# Patient Record
Sex: Female | Born: 1990 | Race: White | Hispanic: No | Marital: Married | Smoking: Never smoker
Health system: Southern US, Community
[De-identification: ages and names within clinical notes are randomized; demographics above are authoritative.]

## PROBLEM LIST (undated history)

## (undated) ENCOUNTER — Inpatient Hospital Stay: Payer: Self-pay

## (undated) DIAGNOSIS — Z789 Other specified health status: Secondary | ICD-10-CM

---

## 2016-12-17 DIAGNOSIS — O0993 Supervision of high risk pregnancy, unspecified, third trimester: Secondary | ICD-10-CM | POA: Insufficient documentation

## 2016-12-18 ENCOUNTER — Other Ambulatory Visit: Payer: Self-pay | Admitting: Obstetrics and Gynecology

## 2016-12-18 DIAGNOSIS — Z369 Encounter for antenatal screening, unspecified: Secondary | ICD-10-CM

## 2016-12-31 ENCOUNTER — Encounter: Payer: Self-pay | Admitting: *Deleted

## 2016-12-31 ENCOUNTER — Ambulatory Visit
Admission: RE | Admit: 2016-12-31 | Discharge: 2016-12-31 | Disposition: A | Discharge status: Home / Self Care | Payer: BLUE CROSS/BLUE SHIELD | Source: Ambulatory Visit | Attending: Obstetrics & Gynecology | Admitting: Obstetrics & Gynecology

## 2016-12-31 VITALS — BP 129/71 | HR 74 | Temp 98.1°F | Resp 18 | Wt 181.8 lb

## 2016-12-31 DIAGNOSIS — Z1389 Encounter for screening for other disorder: Secondary | ICD-10-CM | POA: Diagnosis not present

## 2016-12-31 DIAGNOSIS — Z3481 Encounter for supervision of other normal pregnancy, first trimester: Secondary | ICD-10-CM | POA: Diagnosis not present

## 2016-12-31 DIAGNOSIS — Z3A12 12 weeks gestation of pregnancy: Secondary | ICD-10-CM | POA: Insufficient documentation

## 2016-12-31 DIAGNOSIS — Z369 Encounter for antenatal screening, unspecified: Secondary | ICD-10-CM

## 2016-12-31 HISTORY — DX: Other specified health status: Z78.9

## 2016-12-31 NOTE — Progress Notes (Signed)
Length of Consultation: 45 minute consultation   Maria Escobar  was referred to [facility] for genetic counseling to review prenatal screening and testing options.  This note summarizes the information we discussed.    We offered the following routine screening tests for this pregnancy:  First trimester screening, which includes nuchal translucency ultrasound screen and first trimester maternal serum marker screening.  The nuchal translucency has approximately an 80% detection rate for Down syndrome and can be positive for other chromosome abnormalities as well as congenital heart defects.  When combined with a maternal serum marker screening, the detection rate is up to 90% for Down syndrome and up to 97% for trisomy 18.     Maternal serum marker screening, a blood test that measures pregnancy proteins, can provide risk assessments for Down syndrome, trisomy 18, and open neural tube defects (spina bifida, anencephaly). Because it does not directly examine the fetus, it cannot positively diagnose or rule out these problems.  Targeted ultrasound uses high frequency sound waves to create an image of the developing fetus.  An ultrasound is often recommended as a routine means of evaluating the pregnancy.  It is also used to screen for fetal anatomy problems (for example, a heart defect) that might be suggestive of a chromosomal or other abnormality.   Should these screening tests indicate an increased concern, then the following additional testing options would be offered:  The chorionic villus sampling procedure is available for first trimester chromosome analysis.  This involves the withdrawal of a small amount of chorionic villi (tissue from the developing placenta).  Risk of pregnancy loss is estimated to be approximately 1 in 200 to 1 in 100 (0.5 to 1%).  There is approximately a 1% (1 in 100) chance that the CVS chromosome results will be unclear.  Chorionic villi  cannot be tested for neural tube defects.     Amniocentesis involves the removal of a small amount of amniotic fluid from the sac surrounding the fetus with the use of a thin needle inserted through the maternal abdomen and uterus.  Ultrasound guidance is used throughout the procedure.  Fetal cells from amniotic fluid are directly evaluated and > 99.5% of chromosome problems and > 98% of open neural tube defects can be detected. This procedure is generally performed after the 15th week of pregnancy.  The main risks to this procedure include complications leading to miscarriage in less than 1 in 200 cases (0.5%).  As another option for information if the pregnancy is suspected to be an an increased chance for certain chromosome conditions, we also reviewed the availability of cell free fetal DNA testing from maternal blood to determine whether or not the baby may have either Down syndrome, trisomy 54, or trisomy 77.  This test utilizes a maternal blood sample and DNA sequencing technology to isolate circulating cell free fetal DNA from maternal plasma.  The fetal DNA can then be analyzed for DNA sequences that are derived from the three most common chromosomes involved in aneuploidy, chromosomes 13, 18, and 21.  If the overall amount of DNA is greater than the expected level for any of these chromosomes, aneuploidy is suspected.  While we do not consider it a replacement for invasive testing and karyotype analysis, a negative result from this testing would be reassuring, though not a guarantee of a normal chromosome complement for the baby.  An abnormal result is certainly suggestive of an abnormal chromosome complement, though we would still  recommend CVS or amniocentesis to confirm any findings from this testing.  Cystic Fibrosis and Spinal Muscular Atrophy (SMA) screening were also discussed with the patient. Both conditions are recessive, which means that both parents must be carriers in order to have a  child with the disease.  Cystic fibrosis (CF) is one of the most common genetic conditions in persons of Caucasian ancestry.  This condition occurs in approximately 1 in 2,500 Caucasian persons and results in thickened secretions in the lungs, digestive, and reproductive systems.  For a baby to be at risk for having CF, both of the parents must be carriers for this condition.  Approximately 1 in 8125 Caucasian persons is a carrier for CF.  Current carrier testing looks for the most common mutations in the gene for CF and can detect approximately 90% of carriers in the Caucasian population.  This means that the carrier screening can greatly reduce, but cannot eliminate, the chance for an individual to have a child with CF.  If an individual is found to be a carrier for CF, then carrier testing would be available for the partner. As part of [place]'s newborn screening profile, all babies born in the state of [place] will have a two-tier screening process.  Specimens are first tested to determine the concentration of immunoreactive trypsinogen (IRT).  The top 5% of specimens with the highest IRT values then undergo DNA testing using a panel of over 40 common CF mutations. SMA is a neurodegenerative disorder that leads to atrophy of skeletal muscle and overall weakness.  This condition is also more prevalent in the Caucasian population, with 1 in 40-1 in 60 persons being a carrier and 1 in 6,000-1 in 10,000 children being affected.  There are multiple forms of the disease, with some causing death in infancy to other forms with survival into adulthood.  The genetics of SMA is complex, but carrier screening can detect up to 95% of carriers in the Caucasian population.  Similar to CF, a negative result can greatly reduce, but cannot eliminate, the chance to have a child with SMA.  We obtained a detailed family history and pregnancy history.  The remainder of the family history was reported to be unremarkable  for birth defects, intellectual delays, recurrent pregnancy loss or known chromosome abnormalities.  Consanguinity is denied.    Maria Escobar reported that she had vaginal spotting and was informed that he had a subchorionic bleed. No additional complications or exposures are reported.  After consideration of the options, Ms. Rossini elected to proceed with first trimester screening if a subchorionic hemorrhage is not identified.  We reviewed that bleeding and subchorionic hemorrhage have been demonstrated to increase the risk for a false positive.  The couple are undecided about carrier screening for CF and SMA and will contact their physician if screening is desired.  An ultrasound was performed at the time of the visit.  The gestational age was consistent with 12 weeks, 2 days.  Fetal anatomy could not be assessed due to early gestational age.  Please refer to the ultrasound report for details of that study.  Maria Escobar was encouraged to call with questions or concerns.  We can be contacted at [phone].   Carilyn GoodpastureKristin Nunez CGC  Raymond Azure, [place], MD

## 2017-01-07 ENCOUNTER — Telehealth: Payer: Self-pay | Admitting: Obstetrics and Gynecology

## 2017-01-07 NOTE — Telephone Encounter (Signed)
Ms. Maria Escobar  elected to undergo First Trimester screening on 12/31/2016.  To review, first trimester screening, includes nuchal translucency ultrasound screen and/or first trimester maternal serum marker screening.  The nuchal translucency has approximately an 80% detection rate for Down syndrome and can be positive for other chromosome abnormalities as well as heart defects.  When combined with a maternal serum marker screening, the detection rate is up to 90% for Down syndrome and up to 97% for trisomy 13 and 18.     The results of the First Trimester Nuchal Translucency and Biochemical Screening were within normal range.  The risk for Down syndrome is now estimated to be 1 in >10,000.  The risk for Trisomy 13/18 is 1 in 6,619.  Should more definitive information be desired, we would offer amniocentesis.  Because we do not yet know the effectiveness of combined first and second trimester screening, we do not recommend a maternal serum screen to assess the chance for chromosome conditions.  However, if screening for neural tube defects is desired, maternal serum screening for AFP only can be performed between 15 and [redacted] weeks gestation.     Cherly Andersoneborah F. Dezmin Kittelson, MS, CGC

## 2017-05-06 ENCOUNTER — Inpatient Hospital Stay: Payer: BLUE CROSS/BLUE SHIELD

## 2017-05-06 ENCOUNTER — Inpatient Hospital Stay
Admission: EM | Admit: 2017-05-06 | Discharge: 2017-05-06 | Disposition: A | Discharge status: Home / Self Care | Payer: BLUE CROSS/BLUE SHIELD | Attending: Obstetrics and Gynecology | Admitting: Obstetrics and Gynecology

## 2017-05-06 DIAGNOSIS — O26853 Spotting complicating pregnancy, third trimester: Secondary | ICD-10-CM | POA: Insufficient documentation

## 2017-05-06 DIAGNOSIS — O4693 Antepartum hemorrhage, unspecified, third trimester: Secondary | ICD-10-CM

## 2017-05-06 DIAGNOSIS — Z8249 Family history of ischemic heart disease and other diseases of the circulatory system: Secondary | ICD-10-CM | POA: Insufficient documentation

## 2017-05-06 DIAGNOSIS — Z3A32 32 weeks gestation of pregnancy: Secondary | ICD-10-CM | POA: Insufficient documentation

## 2017-05-06 DIAGNOSIS — Z833 Family history of diabetes mellitus: Secondary | ICD-10-CM | POA: Diagnosis not present

## 2017-05-06 MED ORDER — PRENATAL MULTIVITAMIN CH: 1.0000 | ORAL_TABLET | Freq: Every day | ORAL | Status: DC

## 2017-05-06 MED ORDER — ACETAMINOPHEN 325 MG PO TABS: 650.0000 mg | ORAL_TABLET | ORAL | Status: DC | PRN

## 2017-05-06 MED ORDER — ZOLPIDEM TARTRATE 5 MG PO TABS: 5.0000 mg | ORAL_TABLET | Freq: Every evening | ORAL | Status: DC | PRN

## 2017-05-06 MED ORDER — DOCUSATE SODIUM 100 MG PO CAPS: 100.0000 mg | ORAL_CAPSULE | Freq: Every day | ORAL | Status: DC

## 2017-05-06 MED ORDER — CALCIUM CARBONATE ANTACID 500 MG PO CHEW: 2.0000 | CHEWABLE_TABLET | ORAL | Status: DC | PRN

## 2017-05-06 NOTE — Final Progress Note (Signed)
Pt d/c home with precautions

## 2017-05-06 NOTE — OB Triage Provider Note (Signed)
TRIAGE NOTE to rule out Preterm Labor   History of Present Illness: Maria Escobar is a 26 y.o. G1P0 at [redacted]w[redacted]d presenting to triage for vaginal bleeding on toilet paper this morning. Hx of subchorionic hemorrhage in first trimester, resolved.   Patient reports the fetal movement as active. Patient reports uterine contraction  activity as none. Patient reports  vaginal bleeding as spotting. Patient describes fluid per vagina as None.   Patient Active Problem List   Diagnosis Date Noted  . Encounter for routine screening for malformation using ultrasound     Past Medical History:  Diagnosis Date  . Medical history non-contributory     History reviewed. No pertinent surgical history.  OB History  Gravida Para Term Preterm AB Living  1         0  SAB TAB Ectopic Multiple Live Births               # Outcome Date GA Lbr Len/2nd Weight Sex Delivery Anes PTL Lv  1 Current               Social History   Social History  . Marital status: Married    Spouse name: N/A  . Number of children: N/A  . Years of education: N/A   Social History Main Topics  . Smoking status: Never Smoker  . Smokeless tobacco: Never Used  . Alcohol use No  . Drug use: No  . Sexual activity: Yes   Other Topics Concern  . None   Social History Narrative  . None    Family History  Problem Relation Age of Onset  . Diabetes Father   . Heart disease Father     No Known Allergies  Prescriptions Prior to Admission  Medication Sig Dispense Refill Last Dose  . Prenatal Vit-Fe Fumarate-FA (PRENATAL MULTIVITAMIN) TABS tablet Take 1 tablet by mouth daily at 12 noon.       Review of Systems - See HPI for OB specific ROS.   Vitals:  BP 130/77 (BP Location: Right Arm)   Temp 98.5 F (36.9 C) (Oral)   Resp 18   LMP 10/06/2016  Physical Examination: CONSTITUTIONAL: Well-developed, well-nourished female in no acute distress.  HENT:  Normocephalic, atraumatic EYES: Conjunctivae and EOM are  normal. No scleral icterus.  NECK: Normal range of motion, supple, SKIN: Skin is warm and dry. No rash noted. Not diaphoretic. No erythema. No pallor. NEUROLGIC: Alert and oriented to person, place, and time. No gross cranial nerve deficit noted. PSYCHIATRIC: Normal mood and affect. Normal behavior. Normal judgment and thought content. CARDIOVASCULAR: Normal heart rate noted, regular rhythm RESPIRATORY: Effort and breath sounds normal, no problems with respiration noted ABDOMEN: Soft, nontender, nondistended, gravid.  Cervix: not assessed Membranes:intact Fetal Monitoring:Baseline: 150 bpm, Variability: Good {> 6 bpm), Accelerations: Reactive and Decelerations: Absent Tocometer: occasional contractions   Labs:  No results found for this or any previous visit (from the past 24 hour(s)).  Imaging Studies: US Ob Limited  Result Date: 05/06/2017 CLINICAL DATA:  Vaginal bleeding EXAM: LIMITED OBSTETRIC ULTRASOUND FINDINGS: Number of Fetuses: 1 Heart Rate:  145 bpm Movement: Visualized Presentation: Cephalic Placental Location: Anterior fundal Previa: None Amniotic Fluid (Subjective):  Within normal limits.  AFI:  13.9 cm BPD:  8.09cm 32w  3d MATERNAL FINDINGS: Cervix:  Closed, 3.9 cm in length. Uterus/Adnexae: No abnormality visualized. IMPRESSION: Approximately 32 week 3 day intrauterine pregnancy. Fetal heart rate 145 beats per minute. Cephalic presentation. No acute maternal findings. This exam is performed  on an emergent basis and does not comprehensively evaluate fetal size, dating, or anatomy; follow-up complete OB US should be considered if further fetal assessment is warranted. Electronically Signed   By: Charlett NoseKevin  Dover M.D.   On: 05/06/2017 10:16     Assessment and Plan: Patient Active Problem List   Diagnosis Date Noted  . Encounter for routine screening for malformation using ultrasound     1.Cervical length appropriate. Good AFI. Cat I strip. Discharge home with precautions.    Cline CoolsBethany E Tracina Beaumont, MD, MPH

## 2017-05-06 NOTE — Progress Notes (Signed)
Pt back in OBS 4 frm [place]

## 2017-05-06 NOTE — OB Triage Note (Signed)
Pt states that this morning when she wiped she had some blood on her toilet paper. Some cramping on Tuesday, she hydrated herself and it went away. Denies any pain.

## 2017-07-13 NOTE — L&D Delivery Note (Signed)
Delivery Note At 9:40 PM a viable female was delivered via Vaginal, Spontaneous (Presentation: vtx ROA;  ).  APGAR: 9, 9; weight  Unassigned as of yet.   Placenta status:intact , .  Cord3v  with the following complications:none .  Cord pH: not done Progressed into second stage well  And over an intact perineum delivered a vigorous female McRobert's aided in delivery of the anterior shoulder. Delayed cord clamping . Placenta delivered intact shortly thereafter . Second degree laceration and bilateral labial lac repaired . Rectal exam confirm an intact sphincter / rectum . 100 mcg of Fentanyl IV used for analgesia  Anesthesia:  IV  Stadol and fentanyl  Episiotomy: None Lacerations:  Second  Suture Repair: 2.0 3.0 vicryl Est. Blood Loss (mL):  350 cc  Mom to postpartum.  Baby to Couplet care / Skin to Skin.  Maria Escobar 07/16/2017, 10:13 PM

## 2017-07-16 ENCOUNTER — Encounter: Payer: Self-pay | Admitting: *Deleted

## 2017-07-16 ENCOUNTER — Inpatient Hospital Stay
Admission: EM | Admit: 2017-07-16 | Discharge: 2017-07-18 | DRG: 807 | Disposition: A | Discharge status: Home / Self Care | Payer: BLUE CROSS/BLUE SHIELD | Attending: Obstetrics and Gynecology | Admitting: Obstetrics and Gynecology

## 2017-07-16 DIAGNOSIS — O479 False labor, unspecified: Secondary | ICD-10-CM | POA: Diagnosis present

## 2017-07-16 DIAGNOSIS — O99214 Obesity complicating childbirth: Principal | ICD-10-CM | POA: Diagnosis present

## 2017-07-16 DIAGNOSIS — Z3A40 40 weeks gestation of pregnancy: Secondary | ICD-10-CM

## 2017-07-16 DIAGNOSIS — Z3483 Encounter for supervision of other normal pregnancy, third trimester: Secondary | ICD-10-CM | POA: Diagnosis present

## 2017-07-16 DIAGNOSIS — E669 Obesity, unspecified: Secondary | ICD-10-CM | POA: Diagnosis present

## 2017-07-16 LAB — CBC
HEMATOCRIT: 36.9 % (ref 35.0–47.0)
HEMOGLOBIN: 12.4 g/dL (ref 12.0–16.0)
MCH: 29.6 pg (ref 26.0–34.0)
MCHC: 33.6 g/dL (ref 32.0–36.0)
MCV: 87.9 fL (ref 80.0–100.0)
Platelets: 329 10*3/uL (ref 150–440)
RBC: 4.2 MIL/uL (ref 3.80–5.20)
RDW: 13.6 % (ref 11.5–14.5)
WBC: 19.2 10*3/uL — ABNORMAL HIGH (ref 3.6–11.0)

## 2017-07-16 MED ORDER — AMMONIA AROMATIC IN INHA
RESPIRATORY_TRACT | Status: AC
  Filled 2017-07-16: qty 10

## 2017-07-16 MED ORDER — OXYTOCIN 10 UNIT/ML IJ SOLN
INTRAMUSCULAR | Status: AC
  Filled 2017-07-16: qty 2

## 2017-07-16 MED ORDER — LACTATED RINGERS IV SOLN: INTRAVENOUS | Status: DC

## 2017-07-16 MED ORDER — OXYTOCIN BOLUS FROM INFUSION
500.0000 mL | Freq: Once | INTRAVENOUS | Status: AC
  Administered 2017-07-16: 500 mL via INTRAVENOUS

## 2017-07-16 MED ORDER — LIDOCAINE HCL (PF) 1 % IJ SOLN
INTRAMUSCULAR | Status: AC
  Filled 2017-07-16: qty 30

## 2017-07-16 MED ORDER — LIDOCAINE HCL (PF) 1 % IJ SOLN: 30.0000 mL | INTRAMUSCULAR | Status: DC | PRN

## 2017-07-16 MED ORDER — IBUPROFEN 600 MG PO TABS
ORAL_TABLET | ORAL | Status: AC
  Administered 2017-07-16: 600 mg via ORAL
  Filled 2017-07-16: qty 1

## 2017-07-16 MED ORDER — FENTANYL CITRATE (PF) 100 MCG/2ML IJ SOLN
INTRAMUSCULAR | Status: AC
  Administered 2017-07-16 (×2): 50 ug
  Filled 2017-07-16: qty 2

## 2017-07-16 MED ORDER — ACETAMINOPHEN 325 MG PO TABS: 650.0000 mg | ORAL_TABLET | ORAL | Status: DC | PRN

## 2017-07-16 MED ORDER — OXYTOCIN 40 UNITS IN LACTATED RINGERS INFUSION - SIMPLE MED: 2.5000 [IU]/h | INTRAVENOUS | Status: DC

## 2017-07-16 MED ORDER — SOD CITRATE-CITRIC ACID 500-334 MG/5ML PO SOLN: 30.0000 mL | ORAL | Status: DC | PRN

## 2017-07-16 MED ORDER — ONDANSETRON HCL 4 MG/2ML IJ SOLN: 4.0000 mg | Freq: Four times a day (QID) | INTRAMUSCULAR | Status: DC | PRN

## 2017-07-16 MED ORDER — OXYTOCIN 40 UNITS IN LACTATED RINGERS INFUSION - SIMPLE MED
INTRAVENOUS | Status: AC
  Administered 2017-07-16: 500 mL via INTRAVENOUS
  Filled 2017-07-16: qty 1000

## 2017-07-16 MED ORDER — BUTORPHANOL TARTRATE 1 MG/ML IJ SOLN
INTRAMUSCULAR | Status: AC
  Administered 2017-07-16: 1 mg
  Filled 2017-07-16: qty 1

## 2017-07-16 MED ORDER — LACTATED RINGERS IV SOLN: 500.0000 mL | INTRAVENOUS | Status: DC | PRN

## 2017-07-16 MED ORDER — IBUPROFEN 600 MG PO TABS
600.0000 mg | ORAL_TABLET | Freq: Four times a day (QID) | ORAL | Status: DC
  Administered 2017-07-16 – 2017-07-18 (×6): 600 mg via ORAL
  Filled 2017-07-16 (×5): qty 1

## 2017-07-16 MED ORDER — MISOPROSTOL 200 MCG PO TABS
ORAL_TABLET | ORAL | Status: AC
  Filled 2017-07-16: qty 4

## 2017-07-16 MED ORDER — BUTORPHANOL TARTRATE 1 MG/ML IJ SOLN: 1.0000 mg | INTRAMUSCULAR | Status: DC | PRN

## 2017-07-16 NOTE — H&P (Signed)
Maria Escobar is a 27 y.o. female presenting for advanced cervical dilation .   [facility] . Obesity   GBS neg   OB History    Gravida Para Term Preterm AB Living   1         0   SAB TAB Ectopic Multiple Live Births                 Past Medical History:  Diagnosis Date  . Medical history non-contributory    History reviewed. No pertinent surgical history. Family History: family history includes Diabetes in her father; Heart disease in her father. Social History:  reports that  has never smoked. she has never used smokeless tobacco. She reports that she does not drink alcohol or use drugs.     Maternal Diabetes: No Genetic Screening: Normal Maternal Ultrasounds/Referrals: Normal Fetal Ultrasounds or other Referrals:  None Maternal Substance Abuse:  No Significant Maternal Medications:  None Significant Maternal Lab Results:  None Other Comments:  None  ROS: Unremarkable  History Dilation: 9 Effacement (%): 90, 100 Station: -1 Exam by:: MBS   TJS : AROM clear  Blood pressure 132/81, pulse (!) 107, temperature 97.6 F (36.4 C), temperature source Oral, resp. rate 18, height 5\' 4"  (1.626 m), weight 95.7 kg (211 lb), last menstrual period 10/06/2016. Exam Physical Exam   Lungs CTA  cv RRR  abd : gravid  cx as above   Reassuring fetal monitoring  Prenatal labs: ABO, Rh:  A+ Antibody:  neg  Rubella:  IMM , Var Imm RPR:  nr  HBsAg:  neg   HIV:   Neg  GBS:     Assessment/Plan: Active labor   Anticipate second stage shortly  1 Mg stadol IV  Reassuring fetal monitoring     Maria Escobar 07/16/2017, 8:47 PM

## 2017-07-16 NOTE — OB Triage Note (Signed)
Recvd pt from ED. Pt c/o contractions every 2 min that started around 1200 this after noon. No leaking of fluid but has had some bloody mucous. Feeling baby move well.

## 2017-07-16 NOTE — Discharge Summary (Signed)
Obstetric Discharge Summary   Patient Name: Maria Escobar DOB: May 02, 1991 MRN: [id number]  Date of Admission: 07/16/2017 Date of Delivery: 07/16/2017 Delivered WU:[id number]:Schermerhorn, MD Date of Discharge: 07/18/2017 [facility]  AOZ:[id number]:Patient's last menstrual period was 10/06/2016. EDC Estimated Date of Delivery: 07/13/17 Gestational Age at Delivery: 10151w3d   Antepartum complications: Obesity, [facility]  Admitting Diagnosis: advanced cervical dilation, term pregnancy  Secondary Diagnoses: Patient Active Problem List   Diagnosis Date Noted  . Uterine contractions during pregnancy 07/16/2017  . Encounter for routine screening for malformation using ultrasound   . Supervision of high risk pregnancy in third trimester 12/17/2016    Augmentation: AROM Complications: None Intrapartum complications/course: At 9:40 PM on 07/16/17 a viable female was delivered via Vaginal, Spontaneous (Presentation: vtx ROA ) with APGARs 9, 9. Placenta status: intact , cord 3v, with no complications.  Progressed into second stage well and over an intact perineum delivered a vigorous female, with McRobert's aided in delivery of the anterior shoulder. Delayed cord clamping . Placenta delivered intact shortly thereafter. Second degree laceration and bilateral labial lac repaired. Rectal exam confirm an intact sphincter / rectum. 100 mcg of Fentanyl IV used for analgesia.  Anesthesia:  IV  Stadol and fentanyl  Episiotomy: None Lacerations:  Second  Suture Repair: 2.0 3.0 vicryl Est. Blood Loss (mL):  350 cc  Delivery Type: spontaneous vaginal delivery Placenta: sponatneous Laceration: second degree Episiotomy: none  Newborn Data: FEMALE  Apgars 9/9  Postpartum Course  Patient had an uncomplicated postpartum course.  By time of discharge on PPD#2, her pain was controlled on oral pain medications; she had appropriate lochia and was ambulating, voiding without difficulty and tolerating regular diet.  She was  deemed stable for discharge to home.      Labs: CBC Latest Ref Rng & Units 07/17/2017 07/16/2017  WBC 3.6 - 11.0 K/uL 24.2(H) 19.2(H)  Hemoglobin 12.0 - 16.0 g/dL 10.3(L) 12.4  Hematocrit 35.0 - 47.0 % 30.6(L) 36.9  Platelets 150 - 440 K/uL 305 329   A POS  Physical exam:  BP 104/66 (BP Location: Left Arm)   Pulse 93   Temp 97.9 F (36.6 C) (Oral)   Resp 20   Ht 5\' 4"  (1.626 m)   Wt 95.7 kg (211 lb)   LMP 10/06/2016   SpO2 99%   BMI 36.22 kg/m  General: alert and no distress Pulm: normal respiratory effort Lochia: appropriate Abdomen: soft, NT Uterine Fundus: firm, below umbilicus Extremities: No evidence of DVT seen on physical exam. No lower extremity edema.  Disposition: stable, discharge to home Baby Feeding: breastmilk Baby Disposition: home with mom  Contraception: plans IUD  Prenatal Labs:  ABO, Rh:  A+ Antibody:  neg  Rubella:  IMM , Var Imm RPR:  nr  HBsAg:  neg   HIV:   Neg  GBS:   neg    Plan:  Maria BolognaElizabeth Gaertner was discharged to home in good condition. [facility] provider in 6 weeks  Discharge Instructions: Per After Visit Summary. Activity: Advance as tolerated. Pelvic rest for 6 weeks.   Diet: Regular Discharge Medications: Allergies as of 07/18/2017   No Known Allergies     Medication List    STOP taking these medications   folic acid 1 MG tablet Commonly known as:  FOLVITE     TAKE these medications   ferrous sulfate 325 (65 FE) MG EC tablet Take 325 mg by mouth 3 (three) times daily with meals.  omeprazole 10 MG capsule Commonly known as:  PRILOSEC Take 10 mg by mouth daily.   prenatal multivitamin Tabs tablet Take 1 tablet by mouth daily at 12 noon.      Outpatient follow up:  Follow-up Information    Schermerhorn, Ihor Austin, MD. Schedule an appointment as soon as possible for a visit in 6 week(s).   Specialty:  Obstetrics and Gynecology Why:  routine postpartum visit Contact  information: [address] [place] [address] [phone]            Signed:  Genia Del, CNM 07/18/2017 9:53 AM

## 2017-07-16 NOTE — Progress Notes (Signed)
Patient ID: Maria BolognaElizabeth Escobar, female   DOB: 07-Oct-1990, 27 y.o.   MRN: [id number] gbs neg

## 2017-07-17 ENCOUNTER — Other Ambulatory Visit: Payer: Self-pay

## 2017-07-17 LAB — CBC
HEMATOCRIT: 30.6 % — AB (ref 35.0–47.0)
Hemoglobin: 10.3 g/dL — ABNORMAL LOW (ref 12.0–16.0)
MCH: 29.8 pg (ref 26.0–34.0)
MCHC: 33.6 g/dL (ref 32.0–36.0)
MCV: 88.5 fL (ref 80.0–100.0)
PLATELETS: 305 10*3/uL (ref 150–440)
RBC: 3.45 MIL/uL — ABNORMAL LOW (ref 3.80–5.20)
RDW: 13.8 % (ref 11.5–14.5)
WBC: 24.2 10*3/uL — ABNORMAL HIGH (ref 3.6–11.0)

## 2017-07-17 LAB — TYPE AND SCREEN
ABO/RH(D): A POS
ANTIBODY SCREEN: NEGATIVE
WEAK D: POSITIVE

## 2017-07-17 MED ORDER — SENNOSIDES-DOCUSATE SODIUM 8.6-50 MG PO TABS
2.0000 | ORAL_TABLET | ORAL | Status: DC
  Administered 2017-07-17: 2 via ORAL
  Filled 2017-07-17: qty 2

## 2017-07-17 MED ORDER — ONDANSETRON HCL 4 MG PO TABS: 4.0000 mg | ORAL_TABLET | ORAL | Status: DC | PRN

## 2017-07-17 MED ORDER — OXYCODONE HCL 5 MG PO TABS: 5.0000 mg | ORAL_TABLET | ORAL | Status: DC | PRN

## 2017-07-17 MED ORDER — ONDANSETRON HCL 4 MG/2ML IJ SOLN: 4.0000 mg | INTRAMUSCULAR | Status: DC | PRN

## 2017-07-17 MED ORDER — COCONUT OIL OIL
1.0000 "application " | TOPICAL_OIL | Status: DC | PRN
  Filled 2017-07-17: qty 120

## 2017-07-17 MED ORDER — MEASLES, MUMPS & RUBELLA VAC ~~LOC~~ INJ
0.5000 mL | INJECTION | Freq: Once | SUBCUTANEOUS | Status: DC
  Filled 2017-07-17: qty 0.5

## 2017-07-17 MED ORDER — DIBUCAINE 1 % RE OINT: 1.0000 "application " | TOPICAL_OINTMENT | RECTAL | Status: DC | PRN

## 2017-07-17 MED ORDER — OXYCODONE HCL 5 MG PO TABS: 10.0000 mg | ORAL_TABLET | ORAL | Status: DC | PRN

## 2017-07-17 MED ORDER — ACETAMINOPHEN 325 MG PO TABS
650.0000 mg | ORAL_TABLET | ORAL | Status: DC | PRN
  Administered 2017-07-17: 650 mg via ORAL
  Filled 2017-07-17: qty 2

## 2017-07-17 MED ORDER — BENZOCAINE-MENTHOL 20-0.5 % EX AERO: 1.0000 "application " | INHALATION_SPRAY | CUTANEOUS | Status: DC | PRN

## 2017-07-17 MED ORDER — SIMETHICONE 80 MG PO CHEW: 80.0000 mg | CHEWABLE_TABLET | ORAL | Status: DC | PRN

## 2017-07-17 MED ORDER — DIPHENHYDRAMINE HCL 25 MG PO CAPS: 25.0000 mg | ORAL_CAPSULE | Freq: Four times a day (QID) | ORAL | Status: DC | PRN

## 2017-07-17 MED ORDER — WITCH HAZEL-GLYCERIN EX PADS: 1.0000 "application " | MEDICATED_PAD | CUTANEOUS | Status: DC | PRN

## 2017-07-17 MED ORDER — ZOLPIDEM TARTRATE 5 MG PO TABS
5.0000 mg | ORAL_TABLET | Freq: Every evening | ORAL | Status: DC | PRN
Start: 2017-07-17 — End: 2017-07-18

## 2017-07-17 MED ORDER — MAGNESIUM HYDROXIDE 400 MG/5ML PO SUSP: 30.0000 mL | ORAL | Status: DC | PRN

## 2017-07-17 MED ORDER — FERROUS SULFATE 325 (65 FE) MG PO TABS
325.0000 mg | ORAL_TABLET | Freq: Two times a day (BID) | ORAL | Status: DC
  Administered 2017-07-17 (×2): 325 mg via ORAL
  Filled 2017-07-17 (×2): qty 1

## 2017-07-17 MED ORDER — PRENATAL MULTIVITAMIN CH
1.0000 | ORAL_TABLET | Freq: Every day | ORAL | Status: DC
  Administered 2017-07-17: 1 via ORAL
  Filled 2017-07-17: qty 1

## 2017-07-17 NOTE — Progress Notes (Signed)
Post Partum Day 1  Subjective: Doing well, no concerns. Ambulating without difficulty, pain managed with PO meds, tolerating regular diet, and voiding without difficulty.   No fever/chills, chest pain, shortness of breath, nausea/vomiting, or leg pain. No nipple or breast pain.   Objective: BP (!) 110/56 (BP Location: Left Arm)   Pulse 98   Temp 98.2 F (36.8 C) (Oral)   Resp 18   Ht 5\' 4"  (1.626 m)   Wt 95.7 kg (211 lb)   LMP 10/06/2016   SpO2 100%   BMI 36.22 kg/m    Physical Exam:  General: alert, cooperative, appears stated age and no distress CV: RRR Pulm: nl effort, CTABL Abdomen: soft, non-tender, active bowel sounds Uterine Fundus: firm Lochia: appropriate DVT Evaluation: No evidence of DVT seen on physical exam. No cords or calf tenderness. No significant calf/ankle edema.  Recent Labs    07/16/17 2025 07/17/17 0825  HGB 12.4 10.3*  HCT 36.9 30.6*  WBC 19.2* 24.2*  PLT 329 305    Assessment/Plan: 27 y.o. G1P0 postpartum day #1  -Continue routine PP care -Lactation consult discussed and placed  -Immunization status: all up to date  Disposition: Plan for discharge home tomorrow   LOS: 1 day   Genia DelMargaret Farrell Broerman, CNM 07/17/2017, 9:30 AM   ----- Genia DelMargaret Blaize Nipper Certified Nurse Midwife [facility]

## 2017-07-17 NOTE — Discharge Instructions (Signed)
After Your Delivery Discharge Instructions   Postpartum: Care Instructions  After childbirth (postpartum period), your body goes through many changes. Some of these changes happen over several weeks. In the hours after delivery, your body will begin to recover from childbirth while it prepares to breastfeed your newborn. You may feel emotional during this time. Your hormones can shift your mood without warning for no clear reason.  In the first couple of weeks after childbirth, many women have emotions that change from happy to sad. You may find it hard to sleep. You may cry a lot. This is called the "baby blues." These overwhelming emotions often go away within a couple of days or weeks. But it's important to discuss your feelings with your doctor.  You should call your care provider if you have unrelieved feelings of:  Inability to cope  Sadness  Anxiety  Lack of interest in baby  Insomnia  Crying  It is easy to get too tired and overwhelmed during the first weeks after childbirth. Don't try to do too much. Get rest whenever you can, accept help from others, and eat well and drink plenty of fluids.  About 4 to 6 weeks after your baby's birth, you will have a follow-up visit with your care provider. This visit is your time to talk to your provider about anything you are concerned or curious about.  Follow-up care is a key part of your treatment and safety. Be sure to make and go to all appointments, and call your doctor if you are having problems. It's also a good idea to know your test results and keep a list of the medicines you take.  How can you care for yourself at home?  Sleep or rest when your baby sleeps.  Get help with household chores from family or friends, if you can. Do not try to do it all yourself.  If you have hemorrhoids or swelling or pain around the opening of your vagina, try using cold and heat. You can put ice or a cold pack on the area for 10 to 20 minutes at  a time. Put a thin cloth between the ice and your skin. Also try sitting in a few inches of warm water (sitz bath) 3 times a day and after bowel movements.  Take pain medicines exactly as directed.  If the provider gave you a prescription medicine for pain, take it as prescribed.  If you do not have a prescription and need something over the counter, you can take:  Ibuprofen (Motrin, Advil) up to 600mg every 6 hours as needed for pain  Acetaminophen (Tylenol) up to 650mg every 4 hours as needed for pain  Some people find it helpful to alternate between these two medications.   No driving for 1-2 weeks or while taking pain medications.   Eat more fiber to avoid constipation. Include foods such as whole-grain breads and cereals, raw vegetables, raw and dried fruits, and beans.  Drink plenty of fluids, enough so that your urine is light yellow or clear like water. If you have kidney, heart, or liver disease and have to limit fluids, talk with your doctor before you increase the amount of fluids you drink.  Do not put anything in the vagina for 6 weeks. This means no sex, no tampons, no douching, and no enemas.  If you have stitches, keep the area clean by pouring or spraying warm water over the area outside your vagina and anus after you use the toilet.    No strenuous activity or heavy lifting for 6 weeks   No tub baths; showers only  Continue prenatal vitamin and iron.  If breastfeeding:  Increase calories and fluids while breastfeeding.  You may have a slight fever when your milk comes in, but it should go away on its own. If it does not, and rises above 101.0 please call the doctor.  For breastfeeding concerns, the lactation consultant can be reached at [phone].  For concerns about your baby, please call your pediatrician.   Keep a list of questions to bring to your postpartum visit. Your questions might be about:  Changes in your breasts, such as lumps or  soreness.  When to expect your menstrual period to start again.  What form of birth control is best for you.  Weight you have put on during the pregnancy.  Exercise options.  What foods and drinks are best for you, especially if you are breastfeeding.  Problems you might be having with breastfeeding.  When you can have sex. Some women may want to talk about lubricants for the vagina.  Any feelings of sadness or restlessness that you are having.   When should you call for help?  Call 911 anytime you think you may need emergency care. For example, call if:  You have thoughts of harming yourself, your baby, or another person.  You passed out (lost consciousness).  Call the office at [phone] or seek immediate medical care if:  If you have heavy bleeding such that you are soaking 1 pad in an hour for 2 hours  You are dizzy or lightheaded, or you feel like you may faint.  You have a fever; a temperature of 101.0 F or greater  Chills  Difficulty urinating  Headache unrelieved by "pain meds"   Visual changes  Pain in the right side of your belly near your ribs  Breasts reddened, hard, hot to the touch or any other breast concerns  Nipple discharge which is foul-smelling or contains pus   Increased pain at the site of the laceration   New pain unrelieved with recommended over-the-counter dosages  Difficulty breathing with or without chest pain   New leg pain, swelling, or redness, especially if it is only on one leg  Any other concerns  Watch closely for changes in your health, and be sure to contact your provider if:  You have new or worse vaginal discharge.  You feel sad or depressed.  You are having problems with your breasts or breastfeeding.    

## 2017-07-18 LAB — RPR: RPR: NONREACTIVE

## 2017-07-18 NOTE — Progress Notes (Signed)
Discharge instructions given to parents. Mom verbalizes understanding of teaching. Infant bracelets matched at discharge. Patient discharged home to care of mother at 1215. 

## 2017-07-28 ENCOUNTER — Ambulatory Visit (INDEPENDENT_AMBULATORY_CARE_PROVIDER_SITE_OTHER): Payer: BLUE CROSS/BLUE SHIELD | Admitting: Podiatry

## 2017-07-28 ENCOUNTER — Encounter: Payer: Self-pay | Admitting: Podiatry

## 2017-07-28 DIAGNOSIS — L603 Nail dystrophy: Secondary | ICD-10-CM | POA: Diagnosis not present

## 2017-07-28 NOTE — Progress Notes (Signed)
  Subjective:  Patient ID: Maria Escobar, female    DOB: 17-Jul-1990,  MRN: [id number] HPI Chief Complaint  Patient presents with  . Nail Problem    Hallux nail left - injury to the nail x 1 year ago, nail continues to get thicker and darker, not sore currently but some days tender, no treatment  . NOTE    Had baby 2 weeks ago    27 y.o. female presents with the above complaint.     Past Medical History:  Diagnosis Date  . Medical history non-contributory    No past surgical history on file.  Current Outpatient Medications:  .  drospirenone-ethinyl estradiol (SYEDA) 3-0.03 MG tablet, , Disp: , Rfl:  .  ferrous sulfate 325 (65 FE) MG tablet, Take by mouth., Disp: , Rfl:  .  omeprazole (PRILOSEC) 20 MG capsule, , Disp: , Rfl: 1 .  Prenatal Vit-Fe Fumarate-FA (PRENATAL MULTIVITAMIN) TABS tablet, Take 1 tablet by mouth daily at 12 noon., Disp: , Rfl:   No Known Allergies Review of Systems  All other systems reviewed and are negative.  Objective:  There were no vitals filed for this visit.  General: Well developed, nourished, in no acute distress, alert and oriented x3   Dermatological: Skin is warm, dry and supple bilateral. Nails x 10 are well maintained; remaining integument appears unremarkable at this time. There are no open sores, no preulcerative lesions, no rash or signs of infection present.  Hallux nail left does demonstrate distal onycholysis discoloration of the nail plate and tenderness on palpation.  The nail plate does not appear to be excessively thickened does appear to be traumatic.  Vascular: Dorsalis Pedis artery and Posterior Tibial artery pedal pulses are 2/4 bilateral with immedate capillary fill time. Pedal hair growth present. No varicosities and no lower extremity edema present bilateral.   Neruologic: Grossly intact via light touch bilateral. Vibratory intact via tuning fork bilateral. Protective threshold with Semmes Wienstein monofilament intact to all  pedal sites bilateral. Patellar and Achilles deep tendon reflexes 2+ bilateral. No Babinski or clonus noted bilateral.   Musculoskeletal: No gross boney pedal deformities bilateral. No pain, crepitus, or limitation noted with foot and ankle range of motion bilateral. Muscular strength 5/5 in all groups tested bilateral.  Gait: Unassisted, Nonantalgic.    Radiographs:  None taken  Assessment & Plan:   Assessment: Nail dystrophy cannot rule out onychomycosis.  Plan: Samples of the hallux nail left were taken today to be sent for pathologic evaluation.  We will follow-up with her in 1 month to discuss her options.  She just gave birth 2 weeks ago and is currently breast-feeding.  We will hold off on any treatment until that is completed and thus removing the nail as an option.     Max T. Lake RoyaleHyatt, [place] Dakota[place]PM

## 2017-08-25 ENCOUNTER — Ambulatory Visit: Payer: BLUE CROSS/BLUE SHIELD | Admitting: Podiatry

## 2017-09-01 ENCOUNTER — Ambulatory Visit (INDEPENDENT_AMBULATORY_CARE_PROVIDER_SITE_OTHER): Payer: BLUE CROSS/BLUE SHIELD | Admitting: Podiatry

## 2017-09-01 ENCOUNTER — Encounter: Payer: Self-pay | Admitting: Podiatry

## 2017-09-01 DIAGNOSIS — L603 Nail dystrophy: Secondary | ICD-10-CM | POA: Diagnosis not present

## 2017-09-01 MED ORDER — NEOMYCIN-POLYMYXIN-HC 1 % OT SOLN: OTIC | 1 refills | Status: AC

## 2017-09-01 NOTE — Progress Notes (Signed)
She presents today for follow-up of her pathology results.  She states that she is breast-feeding 437-week-old baby.  Objective: Vital signs are stable alert oriented x3 pathology reports does demonstrate a yeast a mold and Pseudomonas.  Assessment onychomycosis hallux left with bacterial infection mold infection and yeast infection.  Plan: At this point we discussed laser therapy which she may hope to hold off on until after breast-feeding but we will go ahead and start with Cortisporin Otic.  I will follow-up with her in 1 month

## 2018-06-13 ENCOUNTER — Other Ambulatory Visit: Payer: Self-pay

## 2018-06-13 ENCOUNTER — Ambulatory Visit: Payer: Managed Care, Other (non HMO) | Attending: Certified Nurse Midwife

## 2018-06-13 DIAGNOSIS — R278 Other lack of coordination: Secondary | ICD-10-CM | POA: Diagnosis present

## 2018-06-13 DIAGNOSIS — M533 Sacrococcygeal disorders, not elsewhere classified: Secondary | ICD-10-CM | POA: Insufficient documentation

## 2018-06-13 DIAGNOSIS — M6281 Muscle weakness (generalized): Secondary | ICD-10-CM | POA: Insufficient documentation

## 2018-06-13 DIAGNOSIS — M62838 Other muscle spasm: Secondary | ICD-10-CM | POA: Insufficient documentation

## 2018-06-13 NOTE — Therapy (Signed)
[facility] MAIN [facility] SERVICES [address] [place], [place], [address] Phone: [phone]   Fax:  [phone]  Physical Therapy Evaluation  Patient Details  Name: Maria Escobar MRN: [id number] Date of Birth: [birth year] No data recorded  Encounter Date: 06/13/2018  PT End of Session - 06/13/18 1024    Visit Number  1    Number of Visits  12    Date for PT Re-Evaluation  09/05/18    PT Start Time  0925    PT Stop Time  1023    PT Time Calculation (min)  58 min    Activity Tolerance  Patient tolerated treatment well    Behavior During Therapy  [facility] for tasks assessed/performed       Past Medical History:  Diagnosis Date  . Medical history non-contributory     History reviewed. No pertinent surgical history.  There were no vitals filed for this visit.     Pelvic Floor Physical Therapy Evaluation and Assessment  SCREENING  Falls in last 6 mo: no    Patient's communication preference:   Red Flags:  Have you had any night sweats? none Unexplained weight loss? none Saddle anesthesia? none Unexplained changes in bowel or bladder habits? none  SUBJECTIVE  Patient reports: SUI started ~Feb. 2019 and is worse during the last two UTI's she has had within the last three months.    Social/Family/Vocational History:   Full time, Environmental health practitioner  Recent Procedures/Tests/Findings:  none  Obstetrical History: G1P1, Vaginal delivery, second degree tearing  Gynecological History: 2 UTI's, one bad pap but cleared up  Urinary History: Leakage with stress and with standing after urinating. Has some leakage when first waking up and has to get up at least 1 time per night.  Gastrointestinal History: No problems  Sexual activity/pain: No pain  Location of pain: R SIJ  Current pain:  0/10  Max pain:  5/10 Least pain:  0/10 Nature of pain: Sharp  Patient Goals: To decrease  SUI.   OBJECTIVE  Posture/Observations:  Sitting:  Standing: R PSIS low, L PSIS high, slight genu recurvatum. Supine: L ASIS high, R low Leg-length: R long, L short. *L up-slip  Ober: Pos B  Palpation/Segmental Motion/Joint Play: Tender with pressure along R>L sacral border. TTP through L>R adductors, lumbar paraspinals, QL, Glute min, Piriformis, and deep hip ER's.  Special tests:   Stork: negative Scoliosis: none noted Ober: Pos B Supine-to long-sit: R Long in both Leg-length: 88cm B  Range of Motion/Flexibilty:  Spine: touches floor, all WNL but R hip appears higher with forward fold Hips: B ext to neutral   Strength/MMT:  LE MMT  LE MMT Left Right  Hip flex:  (L2) /5 /5  Hip ext: 5/5 5/5  Hip abd: 5/5 5/5  Hip add: 5/5 5/5  Hip IR 4+/5 4+/5  Hip ER 5/5 4/5 P!     Abdominal:  Palpation: TTP to R>L Psoas and iliacus Diastasis: <2 finger separation with and without contraction   Pelvic Floor External Exam: Introitus Appears: mildly gaping Skin integrity: normal Palpation: TTP at L STP only Cough: paradoxical Prolapse visible?: no Scar mobility: slightly decreased mobility  Internal Vaginal Exam: Strength (PERF): 1/5 Symmetry: Greater TTP on L>R Palpation: TTP through L Posterior PR/PC and coccygeus only and at posterior fourchette.  Prolapse: none   Internal Rectal Exam: Deferred indefinately Strength (PERF): Symmetry: Palpation: Prolapse:   Gait Analysis: not assessed   Pelvic Floor Outcome Measures:  UIQ-7: 52/100  INTERVENTIONS THIS SESSION: Therex: Educated on and practiced side-stretch to decrease imbalance of musculature acting on the pelvis to allow for improved pelvic alignment and decreased pain and SUI. Self-care: Educated on the structure and function of the pelvic floor in relation to their symptoms as well as the POC, and initial HEP in order to set patient expectations and understanding from which we will build on in the  future sessions. Educated on modifying purse and baby holding to improve balance of musculature and allow for decreased pain and SUI.   Total time: 58 min.               Objective measurements completed on examination: See above findings.              PT Education - 06/13/18 1024    Education Details  See Pt. Instructions and Interventions this session.    Person(s) Educated  Patient    Methods  Explanation;Demonstration;Verbal cues;Handout    Comprehension  Verbalized understanding;Returned demonstration;Verbal cues required       PT Short Term Goals - 06/13/18 1331      PT SHORT TERM GOAL #1   Title  Patient will demonstrate improved pelvic alignment and balance of musculature surrounding the pelvis to facilitate decreased PFM spasms and decrease pelvic pain.    Baseline  L up-slip and spasms surrounding L hip.    Time  6    Period  Weeks    Status  New    Target Date  07/25/18      PT SHORT TERM GOAL #2   Title  Patient will demonstrate a coordinated contraction, relaxation, and bulge of the pelvic floor muscles to demonstrate functional recruitment and motion and allow for further strengthening.    Baseline  1/5 contraction, paradoxical contraction with cough and spasms In L posterior PFM     Time  6    Period  Weeks    Status  New    Target Date  07/25/18      PT SHORT TERM GOAL #3   Title  Pt. will describe a decrease in SUI by ~ 50% to demonstrate improved PFM recruitment and improved QOL.    Baseline  Having nocturiax1 and leakage with cough, sneeze, jumping and getting out of bed.    Time  6    Period  Weeks    Status  New    Target Date  07/25/18        PT Long Term Goals - 06/13/18 1328      PT LONG TERM GOAL #1   Title  Patient will report no episodes of SUI over the course of the prior two weeks to demonstrate improved functional ability.    Baseline  SUI with all stress    Time  12    Period  Weeks    Status  New    Target Date   09/05/18      PT LONG TERM GOAL #2   Title  Patient will score at or below 7/100 on the UIQ to demonstrate a clinically meaningful decrease in disability and distress due to pelvic floor dysfunction.    Baseline  UIQ-7: 52/100    Time  12    Period  Weeks    Status  New    Target Date  09/05/18      PT LONG TERM GOAL #3   Title  Patient will describe pain no greater than 1/10 during exercise to demonstrate improved functional ability.  Baseline  max 5/10     Time  12    Period  Weeks    Status  New    Target Date  09/05/18             Plan - 06/13/18 1025    Clinical Impression Statement  Pt. is a 27 y/o female who presents today with cheif c/o SUI and minor R SIJ pain. Her relevant PMH includes 1 vaginal delivery in January 2019 with grade 2 tearing. Her clinical exam reveals a L up-slip and spasms surrounding the L hip> R as well as spasms internally through the L posterior PFM and 1/5 strength with contraction. She will benefit from skilled pelvic PT to address the noted defecits nd to continue to assess for and address other potential causes of SUI and pain.    Clinical Presentation  Stable    Clinical Decision Making  Low    Rehab Potential  Excellent    PT Frequency  1x / week    PT Duration  12 weeks    PT Treatment/Interventions  ADLs/Self Care Home Management;Biofeedback;Electrical Stimulation;Moist Heat;Functional mobility training;Therapeutic activities;Therapeutic exercise;Patient/family education;Neuromuscular re-education;Manual techniques;Scar mobilization;Taping;Energy conservation;Dry needling;Spinal Manipulations;Joint Manipulations    PT Next Visit Plan  TP release vs DN to L hip, sacral mobs, and L up-slip correction    PT Home Exercise Plan  Side-stretch, sling purse and decrease L hip carry of infant    Consulted and Agree with Plan of Care  Patient       Patient will benefit from skilled therapeutic intervention in order to improve the following  deficits and impairments:  Improper body mechanics, Pain, Decreased coordination, Decreased scar mobility, Increased muscle spasms, Postural dysfunction, Decreased strength  Visit Diagnosis: Muscle weakness (generalized)  Other lack of coordination  Other muscle spasm  Sacrococcygeal disorders, not elsewhere classified     Problem List Patient Active Problem List   Diagnosis Date Noted  . Uterine contractions during pregnancy 07/16/2017  . Encounter for routine screening for malformation using ultrasound   . Supervision of high risk pregnancy in third trimester 12/17/2016   Maria MoltKeeli T. Escobar DPT, ATC Maria Escobar 06/13/2018, 1:40 PM  [facility] [address] [place], [place], [address] Phone: [phone]   Fax:  [phone]  Name: Maria Escobar MRN: [id number] Date of Birth: 09/23/1990

## 2018-06-13 NOTE — Patient Instructions (Signed)
   Hold for 30 seconds (5 deep breaths) and repeat 2-3 times on each side once a day      

## 2018-06-20 ENCOUNTER — Ambulatory Visit: Payer: Managed Care, Other (non HMO)

## 2018-06-20 DIAGNOSIS — M62838 Other muscle spasm: Secondary | ICD-10-CM

## 2018-06-20 DIAGNOSIS — M6281 Muscle weakness (generalized): Secondary | ICD-10-CM | POA: Diagnosis not present

## 2018-06-20 DIAGNOSIS — M533 Sacrococcygeal disorders, not elsewhere classified: Secondary | ICD-10-CM

## 2018-06-20 DIAGNOSIS — R278 Other lack of coordination: Secondary | ICD-10-CM

## 2018-06-20 NOTE — Therapy (Signed)
[place] [facility] MAIN [facility] SERVICES [address] [place], [place], [address] Phone: [phone]   Fax:  [phone]  Physical Therapy Treatment  Patient Details  Name: Maria Escobar MRN: [id number] Date of Birth: 08-20-1990 No data recorded  Encounter Date: 06/20/2018  PT End of Session - 06/20/18 1455    Visit Number  2    Number of Visits  12    Date for PT Re-Evaluation  09/05/18    PT Start Time  0927    PT Stop Time  1027    PT Time Calculation (min)  60 min    Activity Tolerance  Patient tolerated treatment well    Behavior During Therapy  [facility] for tasks assessed/performed       Past Medical History:  Diagnosis Date  . Medical history non-contributory     No past surgical history on file.  There were no vitals filed for this visit.    Pelvic Floor Physical Therapy Treatment Note  SCREENING  Changes in medications, allergies, or medical history?: no   SUBJECTIVE  Patient reports: Not much change, a little less pain but is more aware of her hip and has been really trying to hold her son on both hips.   Pain update:  Location of pain: R SIJ  Current pain: 0/10  Max pain: 5/10 Least pain: 0/10 Nature of pain:Sharp  Patient Goals: To decrease SUI.   OBJECTIVE  Changes in: Posture/Observations:  L up-slip at start of session, even ASIS and PSIS following treatment.  Range of Motion/Flexibilty:  Decreased sacral mobility at all sacral borders, R>L.   Palpation: TTP to L Lumbar multifidus, paraspinals, QL and Iliacus.  INTERVENTIONS THIS SESSION: Therex: educated on and practiced L hip-flexor stretch and reviewed importance of L side-stretch and posture to help maintain improvement between visits.  Manual: Performed grade 3-4 PA mobs to all sacral borders and TP release to  Lumbar multifidus, paraspinals, QL and Iliacus to decrease tension acting on the pelvis and allow for success with up-slip correction and  decrease spasm and pain as well as allow for improved recruitment of the PFM for decreased UI. Dry-needle: Performed TPDN with .30x59mm needle and standard approach to Lumbar multifidus, paraspinals, QL and Iliacus to decrease tension acting on the pelvis and allow for success with up-slip correction and decrease spasm and pain as well as allow for improved recruitment of the PFM for decreased UI.  Total time: 60 min.                         PT Education - 06/20/18 1454    Education Details  Given hip-flexor stretch verbally due to computer malfuction.    Person(s) Educated  Patient    Methods  Explanation;Demonstration    Comprehension  Verbalized understanding;Returned demonstration       PT Short Term Goals - 06/13/18 1331      PT SHORT TERM GOAL #1   Title  Patient will demonstrate improved pelvic alignment and balance of musculature surrounding the pelvis to facilitate decreased PFM spasms and decrease pelvic pain.    Baseline  L up-slip and spasms surrounding L hip.    Time  6    Period  Weeks    Status  New    Target Date  07/25/18      PT SHORT TERM GOAL #2   Title  Patient will demonstrate a coordinated contraction, relaxation, and bulge of the pelvic floor  muscles to demonstrate functional recruitment and motion and allow for further strengthening.    Baseline  1/5 contraction, paradoxical contraction with cough and spasms In L posterior PFM     Time  6    Period  Weeks    Status  New    Target Date  07/25/18      PT SHORT TERM GOAL #3   Title  Pt. will describe a decrease in SUI by ~ 50% to demonstrate improved PFM recruitment and improved QOL.    Baseline  Having nocturiax1 and leakage with cough, sneeze, jumping and getting out of bed.    Time  6    Period  Weeks    Status  New    Target Date  07/25/18        PT Long Term Goals - 06/13/18 1328      PT LONG TERM GOAL #1   Title  Patient will report no episodes of SUI over the course of  the prior two weeks to demonstrate improved functional ability.    Baseline  SUI with all stress    Time  12    Period  Weeks    Status  New    Target Date  09/05/18      PT LONG TERM GOAL #2   Title  Patient will score at or below 7/100 on the UIQ to demonstrate a clinically meaningful decrease in disability and distress due to pelvic floor dysfunction.    Baseline  UIQ-7: 52/100    Time  12    Period  Weeks    Status  New    Target Date  09/05/18      PT LONG TERM GOAL #3   Title  Patient will describe pain no greater than 1/10 during exercise to demonstrate improved functional ability.    Baseline  max 5/10     Time  12    Period  Weeks    Status  New    Target Date  09/05/18            Plan - 06/20/18 1455    Clinical Impression Statement  Pt. responded well to all interventions today, demonstrating decreased pain and spasm and improved pelvic alignment following treatment as well as understanding of all education provided. Continue per POC>    Clinical Presentation  Stable    Clinical Decision Making  Low    Rehab Potential  Excellent    PT Frequency  1x / week    PT Duration  12 weeks    PT Treatment/Interventions  ADLs/Self Care Home Management;Biofeedback;Electrical Stimulation;Moist Heat;Functional mobility training;Therapeutic activities;Therapeutic exercise;Patient/family education;Neuromuscular re-education;Manual techniques;Scar mobilization;Taping;Energy conservation;Dry needling;Spinal Manipulations;Joint Manipulations    PT Next Visit Plan  manual PRN around hips, internal TP release and training if pelvis well aligned.    PT Home Exercise Plan  Side-stretch, sling purse and decrease L hip carry of infant, hip-flexor stretch    Consulted and Agree with Plan of Care  Patient       Patient will benefit from skilled therapeutic intervention in order to improve the following deficits and impairments:  Improper body mechanics, Pain, Decreased coordination,  Decreased scar mobility, Increased muscle spasms, Postural dysfunction, Decreased strength  Visit Diagnosis: Muscle weakness (generalized)  Other lack of coordination  Other muscle spasm  Sacrococcygeal disorders, not elsewhere classified     Problem List Patient Active Problem List   Diagnosis Date Noted  . Uterine contractions during pregnancy 07/16/2017  . Encounter for  routine screening for malformation using ultrasound   . Supervision of high risk pregnancy in third trimester 12/17/2016   Cleophus MoltKeeli T. Knox Cervi DPT, ATC Cleophus MoltKeeli T Cathlyn Tersigni 06/20/2018, 2:59 PM  [facility] [address] [place], [place], [address] Phone: [phone]   Fax:  [phone]  Name: Maria Escobar MRN: [id number] Date of Birth: 1991-02-25

## 2018-06-27 ENCOUNTER — Ambulatory Visit: Payer: Managed Care, Other (non HMO)

## 2018-06-27 DIAGNOSIS — M6281 Muscle weakness (generalized): Secondary | ICD-10-CM

## 2018-06-27 DIAGNOSIS — M533 Sacrococcygeal disorders, not elsewhere classified: Secondary | ICD-10-CM

## 2018-06-27 DIAGNOSIS — M62838 Other muscle spasm: Secondary | ICD-10-CM

## 2018-06-27 DIAGNOSIS — R278 Other lack of coordination: Secondary | ICD-10-CM

## 2018-06-27 NOTE — Patient Instructions (Addendum)
  Shoulder Retraction and Downward Rotation   Rotate the shoulder blades back and down as if you had to hold a pencil between them, holding for 1 full second each time. Repeat this _3_ times _10_ times per day.      Start with Shoulder Retraction and Downward Rotation pictures above, holding the position while pulling the chin straight back as if trying to make a "double chin".  Breathe in forward and breathe out as you pull back, repeating this _3__ times __10__ times per day.    Place foam roller or towel under your upper back between your shoulder blades. Support your head with your hands, elbows forward, and gently rock back and forth and side to side to improve motion in your back.   Move the foam roller or towel up and down to a few spots in the upper back, repeating the process.    Kegel exercises:    With neutral spine, tighten pelvic floor by imagining you are stopping the flow of urine, squeezing only around the vagina and anus.  Quick-Flicks: Pull up and in quickly and then relax allowing just enough time for the muscles to full lengthen before the next contraction. Do _8__ repetitions in a row, stopping if the pelvic floor muscle gets tired and other muscles try to take over.  Long-Holds: Hold for _3__ seconds and then fully release, repeat _5__ times.   Repeat both of these exercises _3-5__ times throughout the day

## 2018-06-27 NOTE — Therapy (Signed)
[place] [facility] [address] [place], [place], [address] Phone: [phone]   Fax:  [phone]  Physical Therapy Treatment  Patient Details  Name: Maria Escobar MRN: [id number] Date of Birth: 20-Oct-1990 No data recorded  Encounter Date: 06/27/2018    Past Medical History:  Diagnosis Date  . Medical history non-contributory     No past surgical history on file.  There were no vitals filed for this visit.    Pelvic Floor Physical Therapy Treatment Note  SCREENING  Changes in medications, allergies, or medical history?: no   SUBJECTIVE  Patient reports: She has been having shoulder pain   Pain update:  Location of pain: neck and shoulders Current pain:  6/10  Max pain:  8/10 Least pain:  0/10 Nature of pain: tight, radiating  Patient Goals: To decrease SUI.   OBJECTIVE  Changes in: Posture/Observations:  Forward head and shoulders, hyperkyphotic.  Pelvic floor: Scar at posterior fourchette demos slightly decreased length, TTP to L posterior PR/PC.   Palpation: TTP to B upper trapezius and cervical paraspinals  INTERVENTIONS THIS SESSION: Manual: Performed TP release to PFM internally on L>R and scar mobility at posterior fourchette to decrease spasm and allow for improved PFM recruitment to decrease SUI. Performed TP release to upper trapezius and cervical paraspinals to decrease pain and spasm and allow for improved ROM to improve posture and allow for improved neural flow to distal structures. Therex: educated on and practiced chin-tucks, scapular retractions, thoracic extensions over a foam roller, and kegels to improve posture, decrease pain, and allow for improved PFM strength to decrease SUI.   Total time: 60 min.                           PT Short Term Goals - 06/13/18 1331      PT SHORT TERM GOAL #1   Title  Patient will demonstrate improved pelvic alignment  and balance of musculature surrounding the pelvis to facilitate decreased PFM spasms and decrease pelvic pain.    Baseline  L up-slip and spasms surrounding L hip.    Time  6    Period  Weeks    Status  New    Target Date  07/25/18      PT SHORT TERM GOAL #2   Title  Patient will demonstrate a coordinated contraction, relaxation, and bulge of the pelvic floor muscles to demonstrate functional recruitment and motion and allow for further strengthening.    Baseline  1/5 contraction, paradoxical contraction with cough and spasms In L posterior PFM     Time  6    Period  Weeks    Status  New    Target Date  07/25/18      PT SHORT TERM GOAL #3   Title  Pt. will describe a decrease in SUI by ~ 50% to demonstrate improved PFM recruitment and improved QOL.    Baseline  Having nocturiax1 and leakage with cough, sneeze, jumping and getting out of bed.    Time  6    Period  Weeks    Status  New    Target Date  07/25/18        PT Long Term Goals - 06/13/18 1328      PT LONG TERM GOAL #1   Title  Patient will report no episodes of SUI over the course of the prior two weeks to demonstrate improved functional ability.  Baseline  SUI with all stress    Time  12    Period  Weeks    Status  New    Target Date  09/05/18      PT LONG TERM GOAL #2   Title  Patient will score at or below 7/100 on the UIQ to demonstrate a clinically meaningful decrease in disability and distress due to pelvic floor dysfunction.    Baseline  UIQ-7: 52/100    Time  12    Period  Weeks    Status  New    Target Date  09/05/18      PT LONG TERM GOAL #3   Title  Patient will describe pain no greater than 1/10 during exercise to demonstrate improved functional ability.    Baseline  max 5/10     Time  12    Period  Weeks    Status  New    Target Date  09/05/18              Patient will benefit from skilled therapeutic intervention in order to improve the following deficits and impairments:     Visit  Diagnosis: No diagnosis found.     Problem List Patient Active Problem List   Diagnosis Date Noted  . Uterine contractions during pregnancy 07/16/2017  . Encounter for routine screening for malformation using ultrasound   . Supervision of high risk pregnancy in third trimester 12/17/2016   Cleophus Molt DPT, ATC Cleophus Molt 06/27/2018, 10:02 AM  [place] [facility] MAIN [facility] SERVICES [address] [place], [place], [address] Phone: [phone]   Fax:  [phone]  Name: Maria Escobar MRN: [id number] Date of Birth: 1991/02/08

## 2018-07-04 ENCOUNTER — Ambulatory Visit: Payer: Managed Care, Other (non HMO)

## 2018-07-04 DIAGNOSIS — M62838 Other muscle spasm: Secondary | ICD-10-CM

## 2018-07-04 DIAGNOSIS — M533 Sacrococcygeal disorders, not elsewhere classified: Secondary | ICD-10-CM

## 2018-07-04 DIAGNOSIS — M6281 Muscle weakness (generalized): Secondary | ICD-10-CM | POA: Diagnosis not present

## 2018-07-04 DIAGNOSIS — R278 Other lack of coordination: Secondary | ICD-10-CM

## 2018-07-04 NOTE — Patient Instructions (Addendum)
    2x15, one time per day, Left side up only.   Below exercise only needed if your pelvis becomes rotated again. You will notice this because you feel pain or weakness in one leg.   Pelvic Rotation: Contract / Relax (Supine)  MET to Correct Right Anteriorly Rotated/Left Posteriorly Rotated Innominate   Begin laying on your back with your feet at 90 degrees. Put a dowel/broomstick  through your legs, behind your right knee and in front of your left knee. Stabilize the dowel on ether side with your hands.  Press down with the right leg and up with the left leg. Hold for 5 seconds  then slowly relax. Repeat 5 times.

## 2018-07-04 NOTE — Therapy (Signed)
[place] MAIN [facility] SERVICES [address] [place], [place], [address] Phone: [phone]   Fax:  [phone]  Physical Therapy Treatment  Patient Details  Name: Maria Escobar MRN: [id number] Date of Birth: September 16, 1990 No data recorded  Encounter Date: 07/04/2018  PT End of Session - 07/04/18 1200    Visit Number  4    Number of Visits  12    Date for PT Re-Evaluation  09/05/18    PT Start Time  0935    PT Stop Time  1035    PT Time Calculation (min)  60 min    Activity Tolerance  Patient tolerated treatment well    Behavior During Therapy  [facility] for tasks assessed/performed       Past Medical History:  Diagnosis Date  . Medical history non-contributory     No past surgical history on file.  There were no vitals filed for this visit.      Pelvic Floor Physical Therapy Treatment Note  SCREENING  Changes in medications, allergies, or medical history?: no    SUBJECTIVE  Patient reports: She is doing her chin-tucks regularly and it helps but is doing much better overall.   Pain update:  Location of pain: neck Current pain:  0/10  Max pain:  3/10 Least pain:  0/10 Nature of pain: achy with sharp occasionally  Patient Goals: To decrease SUI.   OBJECTIVE  Changes in: Posture/Observations:  L anterior rotation of innominate pre-treatment. Resolved following treatment.  Range of Motion/Flexibilty:  Decreased sacral mobility at L>R border and at base.  Palpation: TTP to L Iliacus, obliques and QL  INTERVENTIONS THIS SESSION: Manual: Performed TP release to L Iliacus, obliques and QL followed by grade 3-4 PA mobs to all sacral borders and MET correction for L anterior rotation to improve balance of musculature and decrease spasm. Educated on using tennis ball for spasm reduction and self MET correction PRN if innominate becomes rotated again. Therex: educated on and practiced L thoracic rotation in side-lying to  improve mobility and improve alignment and mobility of thoracolumbar spine following pelvic realignment to allow for maintained improvement and decreased pain  Up the chain at the head and neck.  Total time: 60 min.                        PT Education - 07/04/18 1200    Education Details  See Pt. Instructions and Interventions this session.    Person(s) Educated  Patient    Methods  Explanation;Demonstration;Handout;Verbal cues    Comprehension  Verbalized understanding;Returned demonstration;Verbal cues required       PT Short Term Goals - 06/13/18 1331      PT SHORT TERM GOAL #1   Title  Patient will demonstrate improved pelvic alignment and balance of musculature surrounding the pelvis to facilitate decreased PFM spasms and decrease pelvic pain.    Baseline  L up-slip and spasms surrounding L hip.    Time  6    Period  Weeks    Status  New    Target Date  07/25/18      PT SHORT TERM GOAL #2   Title  Patient will demonstrate a coordinated contraction, relaxation, and bulge of the pelvic floor muscles to demonstrate functional recruitment and motion and allow for further strengthening.    Baseline  1/5 contraction, paradoxical contraction with cough and spasms In L posterior PFM     Time  6  Period  Weeks    Status  New    Target Date  07/25/18      PT SHORT TERM GOAL #3   Title  Pt. will describe a decrease in SUI by ~ 50% to demonstrate improved PFM recruitment and improved QOL.    Baseline  Having nocturiax1 and leakage with cough, sneeze, jumping and getting out of bed.    Time  6    Period  Weeks    Status  New    Target Date  07/25/18        PT Long Term Goals - 06/13/18 1328      PT LONG TERM GOAL #1   Title  Patient will report no episodes of SUI over the course of the prior two weeks to demonstrate improved functional ability.    Baseline  SUI with all stress    Time  12    Period  Weeks    Status  New    Target Date  09/05/18       PT LONG TERM GOAL #2   Title  Patient will score at or below 7/100 on the UIQ to demonstrate a clinically meaningful decrease in disability and distress due to pelvic floor dysfunction.    Baseline  UIQ-7: 52/100    Time  12    Period  Weeks    Status  New    Target Date  09/05/18      PT LONG TERM GOAL #3   Title  Patient will describe pain no greater than 1/10 during exercise to demonstrate improved functional ability.    Baseline  max 5/10     Time  12    Period  Weeks    Status  New    Target Date  09/05/18            Plan - 07/04/18 1201    Clinical Impression Statement  Pt. responded well to all interventions today, demonstrating improved pelvic alignment and decreased spasms following treatment as well as understanding of all education provided. She reports already noticing ~ 80% improveent in her SUI following last visit. Continue per POC.     Clinical Presentation  Stable    Clinical Decision Making  Low    Rehab Potential  Excellent    PT Frequency  1x / week    PT Duration  12 weeks    PT Treatment/Interventions  ADLs/Self Care Home Management;Biofeedback;Electrical Stimulation;Moist Heat;Functional mobility training;Therapeutic activities;Therapeutic exercise;Patient/family education;Neuromuscular re-education;Manual techniques;Scar mobilization;Taping;Energy conservation;Dry needling;Spinal Manipulations;Joint Manipulations    PT Next Visit Plan  manual PRN around hips/neck shoulders PRN, chest stretch, and deep core strengthening     PT Home Exercise Plan  Side-stretch, sling purse and decrease L hip carry of infant, hip-flexor stretch, [place]arrow with L side up, MET correction PRN.     Consulted and Agree with Plan of Care  Patient       Patient will benefit from skilled therapeutic intervention in order to improve the following deficits and impairments:  Improper body mechanics, Pain, Decreased coordination, Decreased scar mobility, Increased muscle spasms,  Postural dysfunction, Decreased strength  Visit Diagnosis: Muscle weakness (generalized)  Other lack of coordination  Other muscle spasm  Sacrococcygeal disorders, not elsewhere classified     Problem List Patient Active Problem List   Diagnosis Date Noted  . Uterine contractions during pregnancy 07/16/2017  . Encounter for routine screening for malformation using ultrasound   . Supervision of high risk pregnancy in third trimester 12/17/2016  Willa Rough DPT, ATC Willa Rough 07/04/2018, 12:04 PM  [place] MAIN [facility] SERVICES [address] [place], [place], [address] Phone: [phone]   Fax:  [phone]  Name: Maria Escobar MRN: [id number] Date of Birth: June 27, 1991

## 2018-07-11 ENCOUNTER — Ambulatory Visit: Payer: Managed Care, Other (non HMO)

## 2018-07-18 ENCOUNTER — Ambulatory Visit: Payer: Managed Care, Other (non HMO) | Attending: Certified Nurse Midwife

## 2018-07-18 DIAGNOSIS — M62838 Other muscle spasm: Secondary | ICD-10-CM | POA: Diagnosis present

## 2018-07-18 DIAGNOSIS — M6281 Muscle weakness (generalized): Secondary | ICD-10-CM | POA: Insufficient documentation

## 2018-07-18 DIAGNOSIS — R278 Other lack of coordination: Secondary | ICD-10-CM | POA: Diagnosis present

## 2018-07-18 DIAGNOSIS — M533 Sacrococcygeal disorders, not elsewhere classified: Secondary | ICD-10-CM | POA: Insufficient documentation

## 2018-07-18 NOTE — Therapy (Signed)
[place] MAIN [facility] SERVICES [address] [place], [place], [address] Phone: [phone]   Fax:  [phone]  Physical Therapy Treatment  Patient Details  Name: Maria Escobar MRN: [id number] Date of Birth: [birth year] No data recorded  Encounter Date: 07/18/2018  PT End of Session - 07/21/18 1203    Visit Number  5    Number of Visits  12    Date for PT Re-Evaluation  09/05/18    PT Start Time  0930    PT Stop Time  1030    PT Time Calculation (min)  60 min    Activity Tolerance  Patient tolerated treatment well    Behavior During Therapy  [facility] for tasks assessed/performed       Past Medical History:  Diagnosis Date  . Medical history non-contributory     No past surgical history on file.  There were no vitals filed for this visit.    Pelvic Floor Physical Therapy Treatment Note  SCREENING  Changes in medications, allergies, or medical history?: no    SUBJECTIVE  Patient reports: Feels 80-90% better, her neck issue has worked itself out. Gets a little twinge in L side but is able to do stretch to get it to release. SUI with cough and worse in the morning.  Pain update:  Location of pain: none Current pain:  0/10  Max pain:  2/10 Least pain:  0/10 Nature of pain: tightness  Patient Goals: To decrease SUI.   OBJECTIVE  Changes in: Posture/Observations:  Pelvis is level.  Abdominal:  Pt is able to recruit TA with min. Cueing but has little endurance and coordination.  Palpation: TTP to L lumbar multifidus and QL.  INTERVENTIONS THIS SESSION: Therex: Educated on and practiced bird-dog with arm or leg, AP shifting in quad, and knee planks to recruit and strengthen the deep-core musculature and stabilize pelvis for decreased likelihood of spasms returning and to improve deep-core endurance to decreased SUI. Manual:   Total time: 60 min.                          PT Education - 07/21/18  1201    Education Details  See Pt. Instructions and Interventions this session.    Person(s) Educated  Patient    Methods  Explanation;Demonstration;Verbal cues;Handout;Tactile cues    Comprehension  Verbalized understanding;Returned demonstration;Verbal cues required;Tactile cues required       PT Short Term Goals - 07/18/18 1029      PT SHORT TERM GOAL #1   Title  Patient will demonstrate improved pelvic alignment and balance of musculature surrounding the pelvis to facilitate decreased PFM spasms and decrease pelvic pain.    Baseline  L up-slip and spasms surrounding L hip.    Time  6    Period  Weeks    Status  Achieved      PT SHORT TERM GOAL #2   Title  Patient will demonstrate a coordinated contraction, relaxation, and bulge of the pelvic floor muscles to demonstrate functional recruitment and motion and allow for further strengthening.    Baseline  1/5 contraction, paradoxical contraction with cough and spasms In L posterior PFM     Time  6    Period  Weeks    Status  Achieved      PT SHORT TERM GOAL #3   Title  Pt. will describe a decrease in SUI by ~ 50% to demonstrate improved PFM recruitment  and improved QOL.    Baseline  Having nocturiax1 and leakage with cough, sneeze, jumping and getting out of bed.    Time  6    Period  Weeks    Status  Achieved        PT Long Term Goals - 07/18/18 1030      PT LONG TERM GOAL #1   Title  Patient will report no episodes of SUI over the course of the prior two weeks to demonstrate improved functional ability.    Baseline  SUI with all stress    Time  12    Period  Weeks    Status  New      PT LONG TERM GOAL #2   Title  Patient will score at or below 7/100 on the UIQ to demonstrate a clinically meaningful decrease in disability and distress due to pelvic floor dysfunction.    Baseline  UIQ-7: 52/100    Time  12    Period  Weeks    Status  New      PT LONG TERM GOAL #3   Title  Patient will describe pain no greater than  1/10 during exercise to demonstrate improved functional ability.    Baseline  max 5/10     Time  12    Period  Weeks    Status  New            Plan - 07/21/18 1205    Clinical Impression Statement  Pt. has met all ST goals and is progressing well toward all long-term goals but continues to demonstrate some SUI with coughing/sneezing and demonstrates poor coordination and weakness of deep-core continue per POC.    Clinical Presentation  Stable    Clinical Decision Making  Low    Rehab Potential  Excellent    PT Frequency  1x / week    PT Duration  12 weeks    PT Treatment/Interventions  ADLs/Self Care Home Management;Biofeedback;Electrical Stimulation;Moist Heat;Functional mobility training;Therapeutic activities;Therapeutic exercise;Patient/family education;Neuromuscular re-education;Manual techniques;Scar mobilization;Taping;Energy conservation;Dry needling;Spinal Manipulations;Joint Manipulations    PT Next Visit Plan  re-check PFM and increase strengthening and deep core strengthening     PT Home Exercise Plan  Side-stretch, sling purse and decrease L hip carry of infant, hip-flexor stretch, Bow-and-arrow with L side up, MET correction PRN, bird-dog, knee plank, AP shifts in quad.     Consulted and Agree with Plan of Care  Patient       Patient will benefit from skilled therapeutic intervention in order to improve the following deficits and impairments:  Improper body mechanics, Pain, Decreased coordination, Decreased scar mobility, Increased muscle spasms, Postural dysfunction, Decreased strength  Visit Diagnosis: Muscle weakness (generalized)  Other lack of coordination  Other muscle spasm  Sacrococcygeal disorders, not elsewhere classified     Problem List Patient Active Problem List   Diagnosis Date Noted  . Uterine contractions during pregnancy 07/16/2017  . Encounter for routine screening for malformation using ultrasound   . Supervision of high risk pregnancy  in third trimester 12/17/2016   Willa Rough DPT, ATC Willa Rough 07/21/2018, 12:31 PM  [place] MAIN [facility] SERVICES [address] [place], [place], [address] Phone: [phone]   Fax:  [phone]  Name: Maria Escobar MRN: [id number] Date of Birth: 09-05-1990

## 2018-07-18 NOTE — Patient Instructions (Signed)
Bracing With Arm / Leg Raise (Quadruped)  Bracing With Arm Raise (Quadruped)    On hands and knees find neutral spine. Tighten pelvic floor and abdominals and hold. Alternately lift arm to shoulder level. Repeat _5__ times. Do _1__ times a day.  * Do legs-only as well working in a circle and focusing on keeping the back flat, shifting as little as possible, smooth motions.      Keep the back flat and even as you slowly and smoothly shift your weight back toward your heels and then forward over your wrists. You can use a small ball in the small of your back or a mirror to help you know your back is flat.   Rock back and forth __20_ times __1_ times per day.   Hold for as long as you can keep good form, working your deep core. Work up to 1:00 per day in short segments. When you can hold for 1 min. Then try to perform on your toes, but back off on your time in each hold. Eventual goal is to be able to hold for 1:30 straight in full plank IN GOOD FORM.

## 2018-07-20 IMAGING — US US OB LIMITED
1 series · 14 of 18 positions shown · non-contrast
Comparison: none

CLINICAL DATA: Vaginal bleeding

EXAM:
LIMITED OBSTETRIC ULTRASOUND

[Series 1: us ob limited · 0.22mm/px · 14 of 18 slices shown]
[im 1/18]
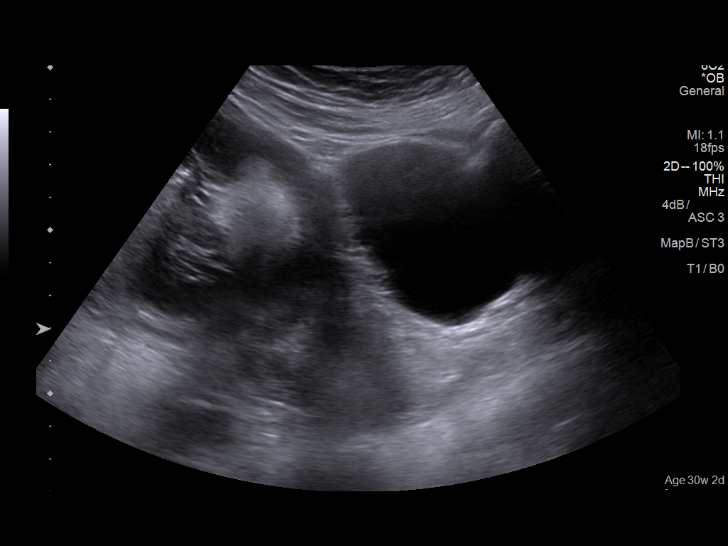
[im 2/18]
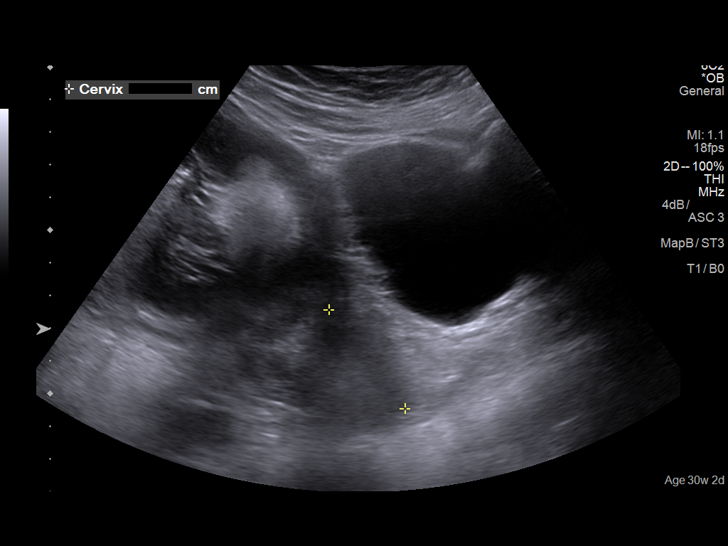
[im 4/18]
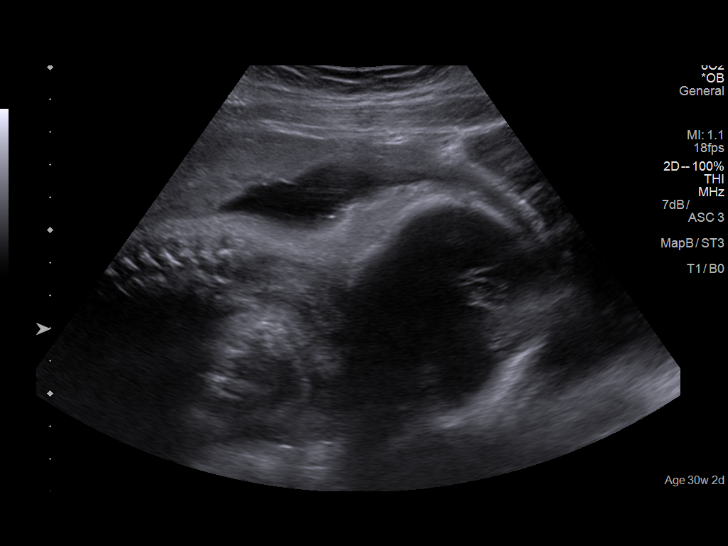
[im 5/18]
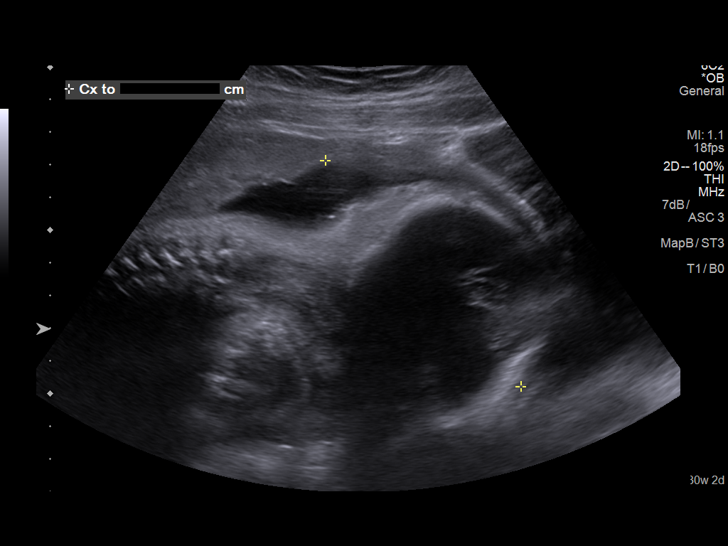
[im 6/18]
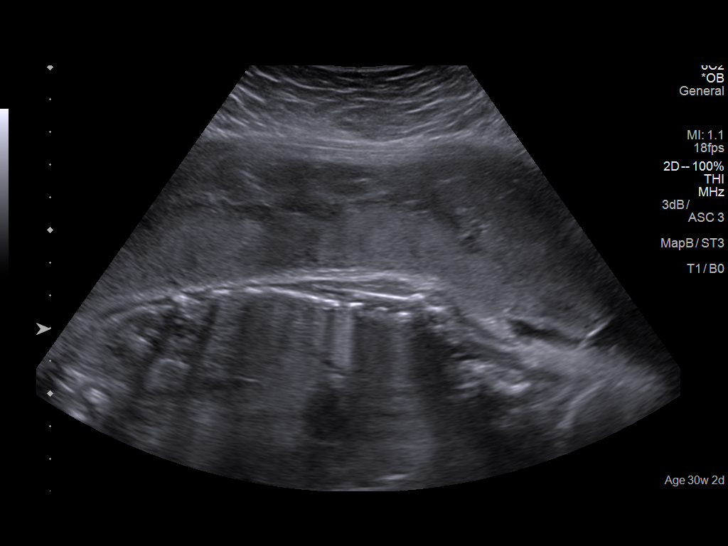
[im 8/18]
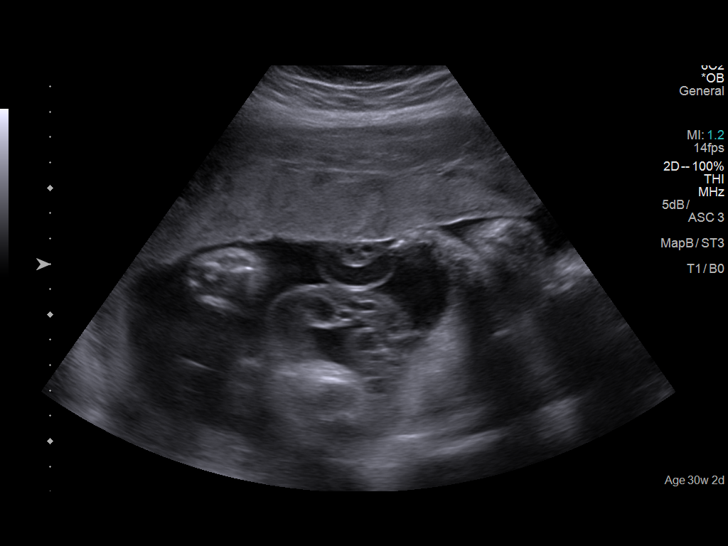
[im 9/18]
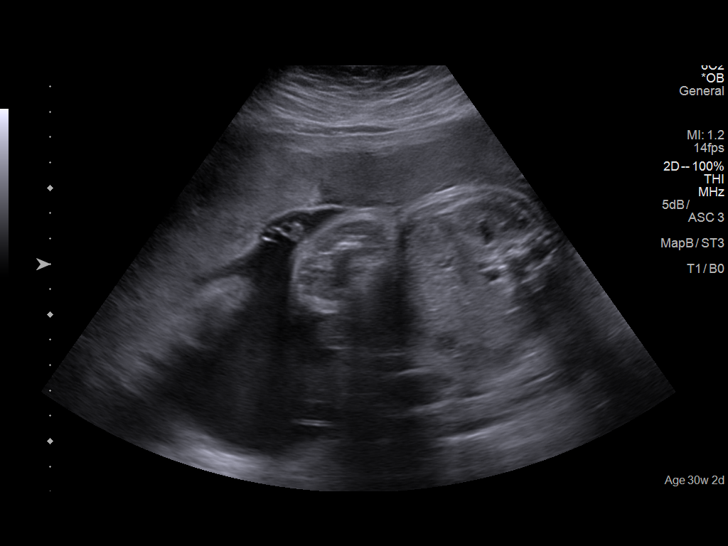
[im 10/18]
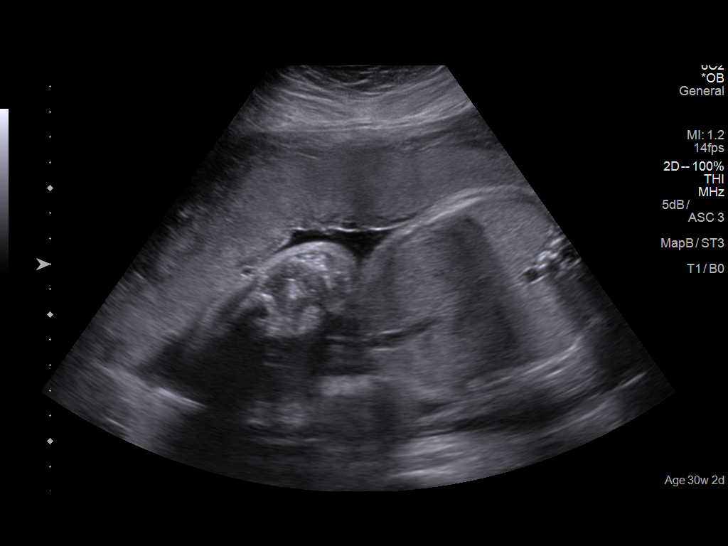
[im 11/18]
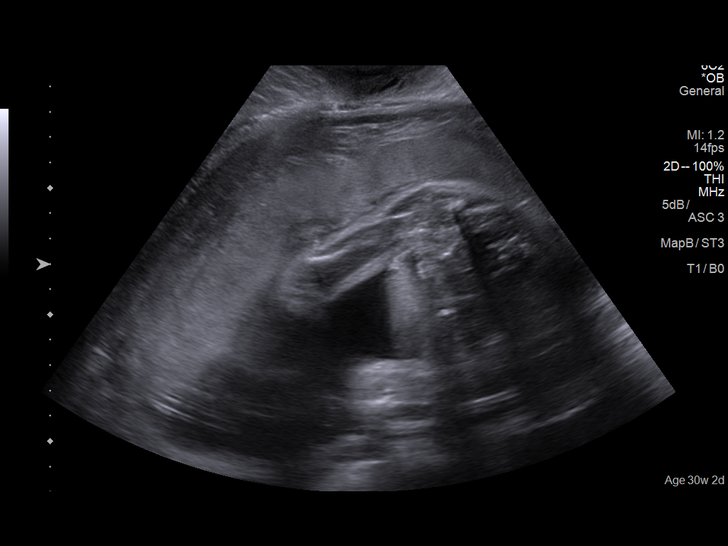
[im 13/18]
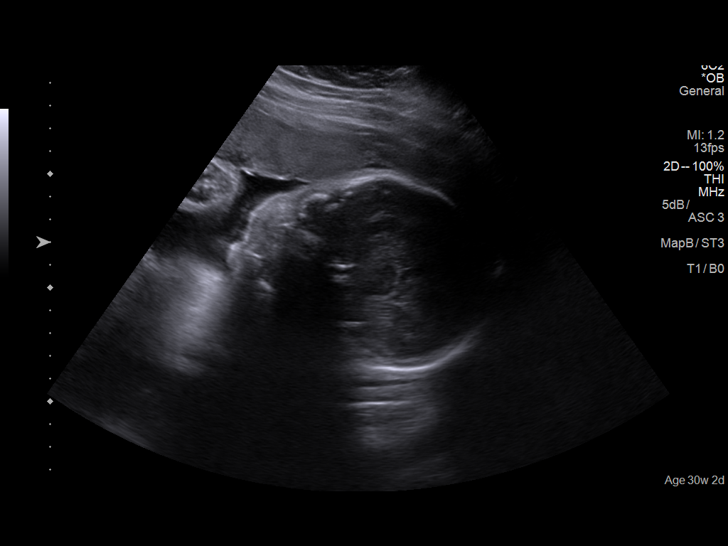
[im 14/18]
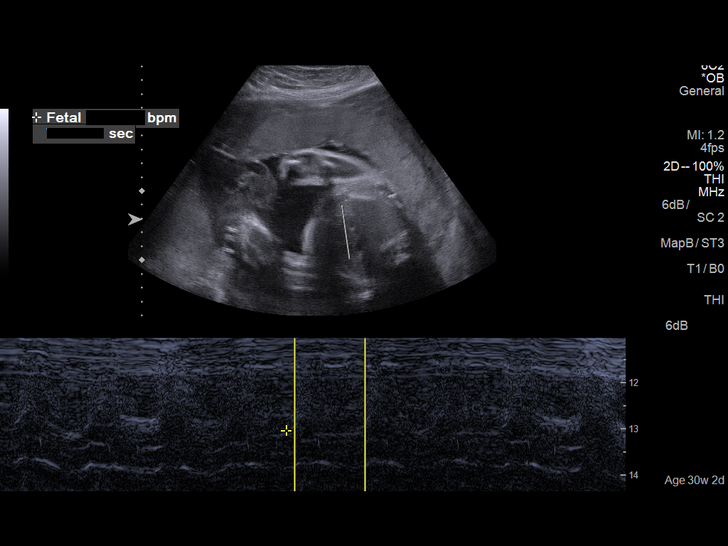
[im 15/18]
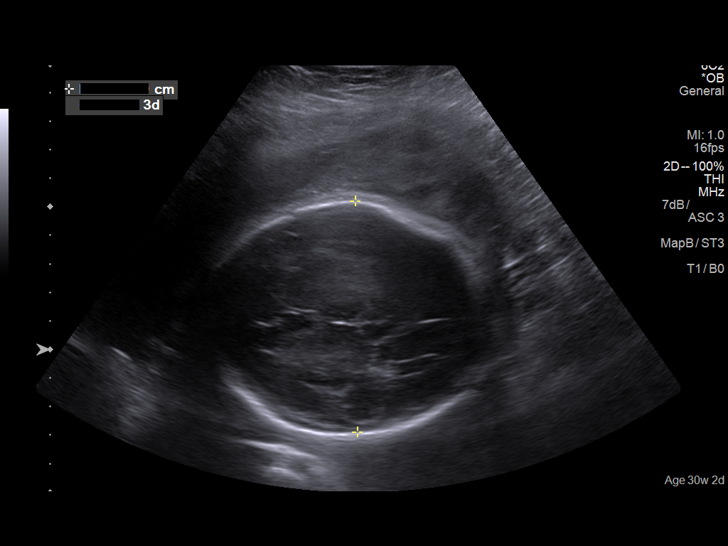
[im 17/18]
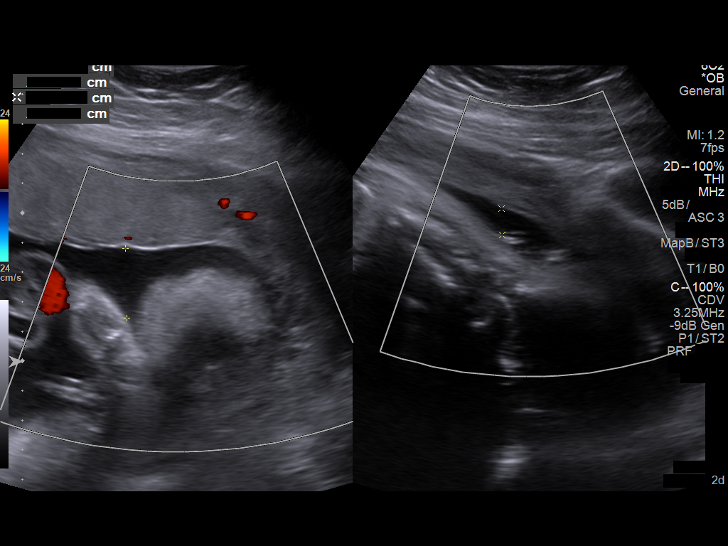
[im 18/18]
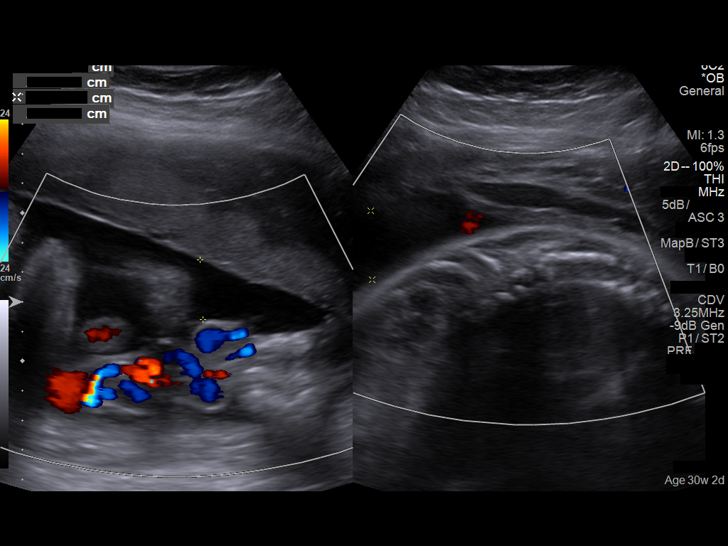

[14 of 18 positions shown; findings below may reference images not displayed]

FINDINGS: Number of Fetuses: 1

Heart Rate:  145 bpm

Movement: Visualized

Presentation: Cephalic

Placental Location: Anterior fundal

Previa: None

Amniotic Fluid (Subjective):  Within normal limits.  AFI:  13.9 cm

BPD:  8.09cm 32w  3d

MATERNAL FINDINGS:

Cervix:  Closed, 3.9 cm in length.

Uterus/Adnexae: No abnormality visualized.
IMPRESSION: Approximately 32 week 3 day intrauterine pregnancy. Fetal heart rate
145 beats per minute. Cephalic presentation. No acute maternal
findings.

This exam is performed on an emergent basis and does not
comprehensively evaluate fetal size, dating, or anatomy; follow-up
complete OB US should be considered if further fetal assessment is
warranted.

## 2018-08-01 ENCOUNTER — Ambulatory Visit: Payer: Managed Care, Other (non HMO)

## 2018-08-01 DIAGNOSIS — M6281 Muscle weakness (generalized): Secondary | ICD-10-CM | POA: Diagnosis not present

## 2018-08-01 DIAGNOSIS — M533 Sacrococcygeal disorders, not elsewhere classified: Secondary | ICD-10-CM

## 2018-08-01 DIAGNOSIS — R278 Other lack of coordination: Secondary | ICD-10-CM

## 2018-08-01 DIAGNOSIS — M62838 Other muscle spasm: Secondary | ICD-10-CM

## 2018-08-01 NOTE — Patient Instructions (Addendum)
   Inhale as you lower down gradually releasing the glutes and the lower tummy muscles. As you exhale, gradually increase the tension in the glutes and lower tummy until you reach the top and give an "extra squeeze" at the top. Repeat 10x_2_, _1-2__ Times per day.  DEVELOPMENTAL POSITION: Tall Kneeling to Half Kneeling    From tall kneeling position, shift weight to one side. Bring opposite leg forward, place foot flat on surface. User exhale to help you stabilize as you shift the leg forward/back. 10___ reps per set, _2__ sets per day, _7__ days per week Repeat with other leg.   Mini-Marches    Exhale, drawing the lower tummy (TA) in toward the back bone and hold contraction while you lift one foot ~ 2 inches off the mat, then the other foot before relaxing and resetting. Try to keep your hips from rocking, using your hands to sense whether they are staying even as pictured.      Perform _10__ repetitions for _3__ sets. Do this _1-2_ times per day.

## 2018-08-01 NOTE — Therapy (Signed)
[place] MAIN [facility] SERVICES [address] [place], [place], [address] Phone: [phone]   Fax:  [phone]  Physical Therapy Treatment  Patient Details  Name: Maria Escobar MRN: [id number] Date of Birth: 03/17/1991 No data recorded  Encounter Date: 08/01/2018  PT End of Session - 08/03/18 1230    Visit Number  6    Number of Visits  12    Date for PT Re-Evaluation  09/05/18    PT Start Time  0930    PT Stop Time  1030    PT Time Calculation (min)  60 min    Activity Tolerance  Patient tolerated treatment well    Behavior During Therapy  [facility] for tasks assessed/performed       Past Medical History:  Diagnosis Date  . Medical history non-contributory     No past surgical history on file.  There were no vitals filed for this visit.    Pelvic Floor Physical Therapy Treatment Note  SCREENING  Changes in medications, allergies, or medical history?: none    SUBJECTIVE  Patient reports: She is doing well, has been able to go back to the gym with confidence, is not jumping yet or running but is able to be on the elliptical fast without any problem.  Precautions:  postpartum  Pain update:  Location of pain: L SIJ Current pain:  0/10  Max pain:  4/10 Least pain:  0/10 Nature of pain: achy  Patient Goals: To decrease SUI.   OBJECTIVE  Changes in: Posture/Observations:  Anterior pelvic tilt  Abdominal:  Pt. Has difficulty recruiting deep-core on L>R but is able to improve consistency of recruitment within session to ~ 90% as cueing decreased from mod to min, to SBA.  Palpation: Pt. Has soreness in TA initially which decreased as she performed subsequent repetitions of mini-march exercise.  INTERVENTIONS THIS SESSION: NM re-ed: reviewed A/P shifts in quad and bird-dog to ensure correct performance and improve deep-core recruitment for maximal strengthening. Educated on and practiced tall-to-half-kneel,  mini-marches, and kneeling squats to improve coordinated recruitment and strengthening of the deep core and glutes to decrease lower-crossed syndrome for long-term relief of Sx.  Therex: Educated on how to lean into the elliptical to engage deep-core and prevent over-use of the low back extensors leading to perpetuation of lower-crossed syndrome.    Total time: 60 min.                         PT Education - 08/03/18 1229    Education Details  See Pt. Instructions and Interventions this session.    Person(s) Educated  Patient    Methods  Explanation;Demonstration;Verbal cues;Tactile cues;Handout    Comprehension  Verbalized understanding;Returned demonstration;Verbal cues required;Tactile cues required       PT Short Term Goals - 07/18/18 1029      PT SHORT TERM GOAL #1   Title  Patient will demonstrate improved pelvic alignment and balance of musculature surrounding the pelvis to facilitate decreased PFM spasms and decrease pelvic pain.    Baseline  L up-slip and spasms surrounding L hip.    Time  6    Period  Weeks    Status  Achieved      PT SHORT TERM GOAL #2   Title  Patient will demonstrate a coordinated contraction, relaxation, and bulge of the pelvic floor muscles to demonstrate functional recruitment and motion and allow for further strengthening.    Baseline  1/5 contraction, paradoxical contraction with cough and spasms In L posterior PFM     Time  6    Period  Weeks    Status  Achieved      PT SHORT TERM GOAL #3   Title  Pt. will describe a decrease in SUI by ~ 50% to demonstrate improved PFM recruitment and improved QOL.    Baseline  Having nocturiax1 and leakage with cough, sneeze, jumping and getting out of bed.    Time  6    Period  Weeks    Status  Achieved        PT Long Term Goals - 07/18/18 1030      PT LONG TERM GOAL #1   Title  Patient will report no episodes of SUI over the course of the prior two weeks to demonstrate improved  functional ability.    Baseline  SUI with all stress    Time  12    Period  Weeks    Status  New      PT LONG TERM GOAL #2   Title  Patient will score at or below 7/100 on the UIQ to demonstrate a clinically meaningful decrease in disability and distress due to pelvic floor dysfunction.    Baseline  UIQ-7: 52/100    Time  12    Period  Weeks    Status  New      PT LONG TERM GOAL #3   Title  Patient will describe pain no greater than 1/10 during exercise to demonstrate improved functional ability.    Baseline  max 5/10     Time  12    Period  Weeks    Status  New            Plan - 08/03/18 1231    Clinical Impression Statement  Pt. is doing well, has minimal leakage with stress remaining but continues to demonstrate significantly weak deep core activation and anterior pelvic tilt. She responded well to all interventions today and will continue to demonstrate improvement with further strengthening. Continue per POC.     Clinical Presentation  Stable    Clinical Decision Making  Low    Rehab Potential  Excellent    PT Frequency  1x / week    PT Duration  12 weeks    PT Treatment/Interventions  ADLs/Self Care Home Management;Biofeedback;Electrical Stimulation;Moist Heat;Functional mobility training;Therapeutic activities;Therapeutic exercise;Patient/family education;Neuromuscular re-education;Manual techniques;Scar mobilization;Taping;Energy conservation;Dry needling;Spinal Manipulations;Joint Manipulations    PT Next Visit Plan  re-check PFM and increase strengthening and deep core strengthening     PT Home Exercise Plan  Side-stretch, sling purse and decrease L hip carry of infant, hip-flexor stretch, Bow-and-arrow with L side up, MET correction PRN, bird-dog, knee plank, AP shifts in quad.     Consulted and Agree with Plan of Care  Patient       Patient will benefit from skilled therapeutic intervention in order to improve the following deficits and impairments:  Improper body  mechanics, Pain, Decreased coordination, Decreased scar mobility, Increased muscle spasms, Postural dysfunction, Decreased strength  Visit Diagnosis: Muscle weakness (generalized)  Other lack of coordination  Other muscle spasm  Sacrococcygeal disorders, not elsewhere classified     Problem List Patient Active Problem List   Diagnosis Date Noted  . Uterine contractions during pregnancy 07/16/2017  . Encounter for routine screening for malformation using ultrasound   . Supervision of high risk pregnancy in third trimester 12/17/2016   Willa Rough DPT, ATC  Willa Rough 08/03/2018, 12:34 PM  [place] MAIN [facility] SERVICES [address] [place], [place], [address] Phone: [phone]   Fax:  [phone]  Name: Cindy Fullman MRN: [id number] Date of Birth: August 23, 1990

## 2018-08-15 ENCOUNTER — Ambulatory Visit: Payer: Managed Care, Other (non HMO) | Attending: Certified Nurse Midwife

## 2018-08-15 DIAGNOSIS — M62838 Other muscle spasm: Secondary | ICD-10-CM | POA: Insufficient documentation

## 2018-08-15 DIAGNOSIS — R278 Other lack of coordination: Secondary | ICD-10-CM | POA: Diagnosis present

## 2018-08-15 DIAGNOSIS — M533 Sacrococcygeal disorders, not elsewhere classified: Secondary | ICD-10-CM

## 2018-08-15 DIAGNOSIS — M6281 Muscle weakness (generalized): Secondary | ICD-10-CM | POA: Diagnosis present

## 2018-08-15 NOTE — Therapy (Signed)
[place] MAIN [facility] SERVICES [address] [place], [place], [address] Phone: [phone]   Fax:  [phone]  Physical Therapy Treatment  Patient Details  Name: Maria Escobar MRN: [id number] Date of Birth: 04-Feb-1991 No data recorded  Encounter Date: 08/15/2018  PT End of Session - 08/15/18 1040    Visit Number  7    Number of Visits  12    Date for PT Re-Evaluation  09/05/18    PT Start Time  0942    PT Stop Time  1032    PT Time Calculation (min)  50 min       Past Medical History:  Diagnosis Date  . Medical history non-contributory     No past surgical history on file.  There were no vitals filed for this visit.      Pelvic Floor Physical Therapy Treatment Note  SCREENING  Changes in medications, allergies, or medical history?: no    SUBJECTIVE  Patient reports: Has done mini-marches consistently, has done tall to half kneel some of the time and forgot all about the kneeling exercise. Has not had any leakage.   Pain update:  Location of pain: L hip Current pain:  0/10  Max pain:  4/10 Least pain:  0/10 Nature of pain: achy  Patient Goals: To decrease SUI.   OBJECTIVE  Changes in: Posture/Observations:  L up-slip and Anterior rotation.  Abdominal:  Less than 1 finger diastasis at umbilicus only  Palpation: TTP to L QL and Iliacus    INTERVENTIONS THIS SESSION: Therex: reviewed MET correction for L anterior rotation, educated on how to have partner help with leg-pull if necessary to improve pelvic alignment at home if it reverts due to hypermobility.Reviewed tall kneeling squats and tall to half kneel transitions to improve performance and increase glute min/max activation and decrease likelihood of spasm return. Manual: Performed TP release to L QL and Iliacus and performed hold-relax lengthening of L hip-flexors to decrease muscular imbalance and allow for correction of alignment. Theract: applied  kinesiotape to L glute min and max to improve proprioception and decreased chance of reversion to malalignment.  Total time: 50 min.                        PT Education - 08/15/18 1034    Education Details  See Pt. Instructions and Interventions this session.    Person(s) Educated  Patient    Methods  Explanation;Tactile cues;Verbal cues;Handout    Comprehension  Verbalized understanding;Returned demonstration;Verbal cues required;Tactile cues required       PT Short Term Goals - 07/18/18 1029      PT SHORT TERM GOAL #1   Title  Patient will demonstrate improved pelvic alignment and balance of musculature surrounding the pelvis to facilitate decreased PFM spasms and decrease pelvic pain.    Baseline  L up-slip and spasms surrounding L hip.    Time  6    Period  Weeks    Status  Achieved      PT SHORT TERM GOAL #2   Title  Patient will demonstrate a coordinated contraction, relaxation, and bulge of the pelvic floor muscles to demonstrate functional recruitment and motion and allow for further strengthening.    Baseline  1/5 contraction, paradoxical contraction with cough and spasms In L posterior PFM     Time  6    Period  Weeks    Status  Achieved      PT  SHORT TERM GOAL #3   Title  Pt. will describe a decrease in SUI by ~ 50% to demonstrate improved PFM recruitment and improved QOL.    Baseline  Having nocturiax1 and leakage with cough, sneeze, jumping and getting out of bed.    Time  6    Period  Weeks    Status  Achieved        PT Long Term Goals - 07/18/18 1030      PT LONG TERM GOAL #1   Title  Patient will report no episodes of SUI over the course of the prior two weeks to demonstrate improved functional ability.    Baseline  SUI with all stress    Time  12    Period  Weeks    Status  New      PT LONG TERM GOAL #2   Title  Patient will score at or below 7/100 on the UIQ to demonstrate a clinically meaningful decrease in disability and  distress due to pelvic floor dysfunction.    Baseline  UIQ-7: 52/100    Time  12    Period  Weeks    Status  New      PT LONG TERM GOAL #3   Title  Patient will describe pain no greater than 1/10 during exercise to demonstrate improved functional ability.    Baseline  max 5/10     Time  12    Period  Weeks    Status  New            Plan - 08/15/18 1656    Clinical Impression Statement  Pt. is continueing to progress toward goals, demonstrating no SUI since prior visit but has had some mild pain one day and demonstrated reversion to L up-slip and anterior rotation today. She responded well to all interventions, demonstrating improved alignment, decrease of spasm and understanding of all education and exercises given/practiced today. Continue per POC.    Clinical Presentation  Stable    Clinical Decision Making  Low    Rehab Potential  Excellent    PT Frequency  1x / week    PT Duration  12 weeks    PT Treatment/Interventions  ADLs/Self Care Home Management;Biofeedback;Electrical Stimulation;Moist Heat;Functional mobility training;Therapeutic activities;Therapeutic exercise;Patient/family education;Neuromuscular re-education;Manual techniques;Scar mobilization;Taping;Energy conservation;Dry needling;Spinal Manipulations;Joint Manipulations    PT Next Visit Plan  re-check PFM and increase strengthening and deep core strengthening. re-assess goals and Determine POC/ D/C?     PT Home Exercise Plan  Side-stretch, sling purse and decrease L hip carry of infant, hip-flexor stretch, Bow-and-arrow with L side up, MET correction PRN, bird-dog, knee plank, AP shifts in quad.     Consulted and Agree with Plan of Care  Patient       Patient will benefit from skilled therapeutic intervention in order to improve the following deficits and impairments:  Improper body mechanics, Pain, Decreased coordination, Decreased scar mobility, Increased muscle spasms, Postural dysfunction, Decreased  strength  Visit Diagnosis: Muscle weakness (generalized)  Other lack of coordination  Other muscle spasm  Sacrococcygeal disorders, not elsewhere classified     Problem List Patient Active Problem List   Diagnosis Date Noted  . Uterine contractions during pregnancy 07/16/2017  . Encounter for routine screening for malformation using ultrasound   . Supervision of high risk pregnancy in third trimester 12/17/2016   Willa Rough DPT, ATC Willa Rough 08/15/2018, 5:01 PM  [place] MAIN [facility] SERVICES [address]  [place], [place], [address] Phone: [phone]   Fax:  [phone]  Name: Maria Escobar MRN: [id number] Date of Birth: 08-27-1990

## 2018-08-15 NOTE — Patient Instructions (Signed)
  MET to Correct a Left Anteriorly Rotated/Right Posteriorly Rotated Innominate using a dowel   Begin on your back with the knees and hips bent to 90 degrees. Use a dowel or broomstick to go through the legs. Have the dowel behind the left leg and in front of the right. Stabilize the dowel on ether side with the hands. Press down with the left leg and up with the right and hold for 5 seconds. Slowly release back to the starting position. Repeat 5 times.

## 2018-09-01 ENCOUNTER — Ambulatory Visit: Payer: Managed Care, Other (non HMO)

## 2018-09-01 DIAGNOSIS — M533 Sacrococcygeal disorders, not elsewhere classified: Secondary | ICD-10-CM

## 2018-09-01 DIAGNOSIS — M62838 Other muscle spasm: Secondary | ICD-10-CM

## 2018-09-01 DIAGNOSIS — M6281 Muscle weakness (generalized): Secondary | ICD-10-CM | POA: Diagnosis not present

## 2018-09-01 DIAGNOSIS — R278 Other lack of coordination: Secondary | ICD-10-CM

## 2018-09-01 NOTE — Therapy (Signed)
[place] MAIN [facility] SERVICES [address] [place], [place], [address] Phone: [phone]   Fax:  [phone]  Physical Therapy Treatment and Discharge Summary  Patient Details  Name: Maria Escobar MRN: [id number] Date of Birth: 06-10-1991 No data recorded  Encounter Date: 09/01/2018  PT End of Session - 09/01/18 2133    Visit Number  8    Number of Visits  12    Date for PT Re-Evaluation  09/05/18    PT Start Time  1630    PT Stop Time  1716    PT Time Calculation (min)  46 min    Activity Tolerance  Patient tolerated treatment well    Behavior During Therapy  [facility] for tasks assessed/performed       Past Medical History:  Diagnosis Date  . Medical history non-contributory     No past surgical history on file.  There were no vitals filed for this visit.    Pelvic Floor Physical Therapy Treatment Note and Discharge Summary  SCREENING  Changes in medications, allergies, or medical history?: No    SUBJECTIVE  Patient reports: Has been doing exercises regularly and is doing well, was able to coach her husband to help her correct for her up-slip and anterior rotation successfully. Has not had any leakage recently.   Pain update:  Location of pain: L hip and low back Current pain:  0/10  Max pain:  5/10 Least pain:  0/10 Nature of pain: achy  Patient Goals: To decrease SUI.   OBJECTIVE  Changes in: Posture/Observations:  Pelvis level, minor anterior pelvic tilt remaining. B over-pronation, adductor dominance.  Strength/MMT:  Weakness of hip ABD  Abdominal:  Deep-core weakness but able to demonstrate appropriate coordination and recruitment.  INTERVENTIONS THIS SESSION: Therex: Educated on and practiced monster walks to strengthen hip ABD and prevent medial collapse and allow for continued improvement in PFM function.  NM re-ed: Educated on and practiced sit-to-stand and reviewed kneeling squats and A/P shifts  with emphasis on coordination of muscles surrounding the pelvis to improve balance of muscles surrounding the pelvis.  Self-care: educated on arch-supports to decrease medial collapse and allow for improved posture and decreased imbalance of musculature through the pelvis.  Total time: 46 min.                         PT Education - 09/01/18 2132    Education Details  See Pt. Instructions and interventions this session.    Person(s) Educated  Patient    Methods  Explanation;Demonstration;Tactile cues;Verbal cues;Handout    Comprehension  Verbalized understanding;Returned demonstration;Verbal cues required;Tactile cues required       PT Short Term Goals - 07/18/18 1029      PT SHORT TERM GOAL #1   Title  Patient will demonstrate improved pelvic alignment and balance of musculature surrounding the pelvis to facilitate decreased PFM spasms and decrease pelvic pain.    Baseline  L up-slip and spasms surrounding L hip.    Time  6    Period  Weeks    Status  Achieved      PT SHORT TERM GOAL #2   Title  Patient will demonstrate a coordinated contraction, relaxation, and bulge of the pelvic floor muscles to demonstrate functional recruitment and motion and allow for further strengthening.    Baseline  1/5 contraction, paradoxical contraction with cough and spasms In L posterior PFM     Time  6  Period  Weeks    Status  Achieved      PT SHORT TERM GOAL #3   Title  Pt. will describe a decrease in SUI by ~ 50% to demonstrate improved PFM recruitment and improved QOL.    Baseline  Having nocturiax1 and leakage with cough, sneeze, jumping and getting out of bed.    Time  6    Period  Weeks    Status  Achieved        PT Long Term Goals - 09/01/18 1637      PT LONG TERM GOAL #1   Title  Patient will report no episodes of SUI over the course of the prior two weeks to demonstrate improved functional ability.    Baseline  SUI with all stress    Time  12    Period   Weeks    Status  Achieved    Target Date  09/05/18      PT LONG TERM GOAL #2   Title  Patient will score at or below 7/100 on the UIQ to demonstrate a clinically meaningful decrease in disability and distress due to pelvic floor dysfunction.    Baseline  UIQ-7: 52/100 As of 2/20: 0/100    Time  12    Period  Weeks    Status  Achieved    Target Date  09/05/18      PT LONG TERM GOAL #3   Title  Patient will describe pain no greater than 1/10 during exercise to demonstrate improved functional ability.    Baseline  max 5/10     Time  12    Period  Weeks    Status  Achieved    Target Date  09/05/18            Plan - 09/01/18 2134    Clinical Impression Statement  Pt. has achieved all goals and demonstrates understanding of all education and exercises provided. She continues to demonstrate some weakness but will continue to improve with continued use of her HEP upon D/C Pt. feels confident in her ability to progress her exercises per reccommendation upon D/C so we will D/C at this time to HEP.    Clinical Presentation  Stable    Clinical Decision Making  Low    Rehab Potential  Excellent    PT Frequency  1x / week    PT Duration  12 weeks    PT Treatment/Interventions  ADLs/Self Care Home Management;Biofeedback;Electrical Stimulation;Moist Heat;Functional mobility training;Therapeutic activities;Therapeutic exercise;Patient/family education;Neuromuscular re-education;Manual techniques;Scar mobilization;Taping;Energy conservation;Dry needling;Spinal Manipulations;Joint Manipulations    PT Next Visit Plan  D/C    PT Home Exercise Plan  Side-stretch, sling purse and decrease L hip carry of infant, hip-flexor stretch, [place] with L side up, MET correction PRN, bird-dog, knee plank, AP shifts in quad.     Consulted and Agree with Plan of Care  Patient       Patient will benefit from skilled therapeutic intervention in order to improve the following deficits and impairments:   Improper body mechanics, Pain, Decreased coordination, Decreased scar mobility, Increased muscle spasms, Postural dysfunction, Decreased strength  Visit Diagnosis: Muscle weakness (generalized)  Other lack of coordination  Other muscle spasm  Sacrococcygeal disorders, not elsewhere classified     Problem List Patient Active Problem List   Diagnosis Date Noted  . Uterine contractions during pregnancy 07/16/2017  . Encounter for routine screening for malformation using ultrasound   . Supervision of high risk pregnancy in third trimester 12/17/2016  Willa Rough DPT, ATC Willa Rough 09/01/2018, 9:50 PM  [place] MAIN [facility] SERVICES [address] [place], [place], [address] Phone: [phone]   Fax:  [phone]  Name: Maria Escobar MRN: [id number] Date of Birth: 06/11/1991

## 2018-09-01 NOTE — Patient Instructions (Signed)
  Do 3 repetitions of 10 steps each direction, once per day.     Sit, feet flat, scoot forward to the edge of the chair. Inhale as you bend forward at hips, begin to exhale just before and while you stand, contracting the glutes, lower tummy muscles and pelvic floor as if stopping urination as you stand up.   * Do this every time you sit or stand! If you catch yourself doing it "wrong, re-set and do it again so it can become habit!     [employer] for higher-quality insoles

## 2020-07-23 ENCOUNTER — Ambulatory Visit: Payer: Managed Care, Other (non HMO)

## 2020-07-23 DIAGNOSIS — Z1152 Encounter for screening for COVID-19: Secondary | ICD-10-CM

## 2020-07-23 LAB — POC SOFIA 2 FLU + SARS ANTIGEN FIA
Influenza A, POC: NEGATIVE
Influenza B, POC: NEGATIVE
SARS Coronavirus 2 Ag: NEGATIVE

## 2022-11-17 ENCOUNTER — Ambulatory Visit: Payer: Managed Care, Other (non HMO) | Admitting: Family Medicine

## 2023-02-19 ENCOUNTER — Ambulatory Visit: Payer: No Typology Code available for payment source | Admitting: Podiatry

## 2023-02-19 ENCOUNTER — Encounter: Payer: Self-pay | Admitting: Podiatry

## 2023-02-19 DIAGNOSIS — B351 Tinea unguium: Secondary | ICD-10-CM

## 2023-02-19 MED ORDER — TERBINAFINE HCL 250 MG PO TABS: 250.0000 mg | ORAL_TABLET | Freq: Every day | ORAL | 0 refills | Status: AC

## 2023-03-01 NOTE — Progress Notes (Signed)
   Chief Complaint  Patient presents with   Nail Problem    "This toenail is loose." N - loose toenail L - left hallux D - damaged 6/7 yrs ago, damaged again 7/29 O - suddenly, gotten worse C - loose, it was throbbing, it's hollow underneath, son jabbed it accidentally A - closed toed shoes T - none    Subjective: 32 y.o. female presenting today as a new patient for evaluation of discoloration with thickening to the toenails.  Patient states that she does have a history of injury to the toenail about 6 to 7 years ago.  Most recently she stubbed her toe which created some tenderness of the last few weeks.  The nail plate is chronically thickened discolored.  Aggravated by close toed shoes.  She would like to address the possible fungal nail infection  Past Medical History:  Diagnosis Date   Medical history non-contributory     No past surgical history on file.  No Known Allergies  Objective: Physical Exam General: The patient is alert and oriented x3 in no acute distress.  Dermatology: Hyperkeratotic, discolored, thickened, onychodystrophy noted. Skin is warm, dry and supple bilateral lower extremities. Negative for open lesions or macerations.  Vascular: Palpable pedal pulses bilaterally. No edema or erythema noted. Capillary refill within normal limits.  Neurological: Grossly intact via light touch  Musculoskeletal Exam: No pedal deformity noted  Assessment: #1 Onychomycosis of toenails  Plan of Care:  #1 Patient was evaluated. #2  Today we discussed different treatment options including oral, topical, and laser antifungal treatment modalities.  We discussed their efficacies and side effects.  Patient opts for oral antifungal treatment modality #3 prescription for Lamisil 250 mg #90 daily. Pt denies a history of liver pathology or symptoms.  Patient is otherwise healthy #4 return to clinic 6 months   Felecia Shelling, DPM [facility]  Dr. Felecia Shelling, DPM    [address] [place], [place] [address]                Office [phone]  Fax [phone]

## 2023-08-12 ENCOUNTER — Encounter: Payer: Self-pay | Admitting: Podiatry
